# Patient Record
Sex: Male | Born: 1970
Health system: Southern US, Community
[De-identification: ages and names within clinical notes are randomized; demographics above are authoritative.]

## PROBLEM LIST (undated history)

## (undated) DIAGNOSIS — I639 Cerebral infarction, unspecified: Secondary | ICD-10-CM

## (undated) DIAGNOSIS — E119 Type 2 diabetes mellitus without complications: Secondary | ICD-10-CM

## (undated) DIAGNOSIS — I10 Essential (primary) hypertension: Secondary | ICD-10-CM

## (undated) DIAGNOSIS — E785 Hyperlipidemia, unspecified: Secondary | ICD-10-CM

## (undated) DIAGNOSIS — K219 Gastro-esophageal reflux disease without esophagitis: Secondary | ICD-10-CM

## (undated) HISTORY — DX: Essential (primary) hypertension: I10

## (undated) HISTORY — DX: Gastro-esophageal reflux disease without esophagitis: K21.9

## (undated) HISTORY — DX: Cerebral infarction, unspecified: I63.9

## (undated) HISTORY — PX: HERNIA REPAIR: SHX51

## (undated) HISTORY — DX: Hyperlipidemia, unspecified: E78.5

## (undated) HISTORY — PX: TONSILLECTOMY: SUR1361

## (undated) HISTORY — DX: Type 2 diabetes mellitus without complications: E11.9

---

## 2008-08-24 ENCOUNTER — Emergency Department (HOSPITAL_COMMUNITY): Admission: EM | Admit: 2008-08-24 | Discharge: 2008-08-24 | Payer: Self-pay | Admitting: Emergency Medicine

## 2011-09-11 ENCOUNTER — Emergency Department (HOSPITAL_COMMUNITY): Payer: Self-pay

## 2011-09-11 ENCOUNTER — Emergency Department (HOSPITAL_COMMUNITY)
Admission: EM | Admit: 2011-09-11 | Discharge: 2011-09-11 | Disposition: A | Discharge status: Home / Self Care | Payer: Self-pay | Attending: Emergency Medicine | Admitting: Emergency Medicine

## 2011-09-11 DIAGNOSIS — R110 Nausea: Secondary | ICD-10-CM | POA: Insufficient documentation

## 2011-09-11 DIAGNOSIS — R1033 Periumbilical pain: Secondary | ICD-10-CM | POA: Insufficient documentation

## 2011-09-11 DIAGNOSIS — F411 Generalized anxiety disorder: Secondary | ICD-10-CM | POA: Insufficient documentation

## 2011-09-11 DIAGNOSIS — K420 Umbilical hernia with obstruction, without gangrene: Secondary | ICD-10-CM | POA: Insufficient documentation

## 2011-09-11 LAB — DIFFERENTIAL
Eosinophils Absolute: 0.2 10*3/uL (ref 0.0–0.7)
Eosinophils Relative: 2 % (ref 0–5)
Lymphs Abs: 4.4 10*3/uL — ABNORMAL HIGH (ref 0.7–4.0)
Monocytes Relative: 10 % (ref 3–12)

## 2011-09-11 LAB — COMPREHENSIVE METABOLIC PANEL
AST: 22 U/L (ref 0–37)
CO2: 29 mEq/L (ref 19–32)
Chloride: 104 mEq/L (ref 96–112)
Creatinine, Ser: 0.81 mg/dL (ref 0.50–1.35)
GFR calc non Af Amer: >60 mL/min (ref >60)
Glucose, Bld: 96 mg/dL (ref 70–99)
Total Bilirubin: 0.4 mg/dL (ref 0.3–1.2)

## 2011-09-11 LAB — URINALYSIS, ROUTINE W REFLEX MICROSCOPIC
Glucose, UA: NEGATIVE mg/dL
Hgb urine dipstick: NEGATIVE
Ketones, ur: NEGATIVE mg/dL
Protein, ur: NEGATIVE mg/dL

## 2011-09-11 LAB — CBC
MCH: 31.6 pg (ref 26.0–34.0)
MCV: 84.3 fL (ref 78.0–100.0)
Platelets: 212 10*3/uL (ref 150–400)
RDW: 12.6 % (ref 11.5–15.5)

## 2011-09-11 MED ORDER — IOHEXOL 300 MG/ML  SOLN
100.0000 mL | Freq: Once | INTRAMUSCULAR | Status: AC | PRN
  Administered 2011-09-11: 100 mL via INTRAVENOUS

## 2016-03-29 ENCOUNTER — Ambulatory Visit (INDEPENDENT_AMBULATORY_CARE_PROVIDER_SITE_OTHER): Payer: Worker's Compensation | Admitting: Family Medicine

## 2016-03-29 ENCOUNTER — Encounter: Payer: Self-pay | Admitting: Family Medicine

## 2016-03-29 ENCOUNTER — Other Ambulatory Visit: Payer: Self-pay | Admitting: Family Medicine

## 2016-03-29 ENCOUNTER — Ambulatory Visit (HOSPITAL_BASED_OUTPATIENT_CLINIC_OR_DEPARTMENT_OTHER)
Admission: RE | Admit: 2016-03-29 | Discharge: 2016-03-29 | Disposition: A | Discharge status: Home / Self Care | Payer: Worker's Compensation | Source: Ambulatory Visit | Attending: Family Medicine | Admitting: Family Medicine

## 2016-03-29 VITALS — BP 141/109 | HR 69 | Ht 71.0 in | Wt 230.0 lb

## 2016-03-29 DIAGNOSIS — S8992XA Unspecified injury of left lower leg, initial encounter: Secondary | ICD-10-CM | POA: Diagnosis not present

## 2016-03-29 DIAGNOSIS — M25562 Pain in left knee: Secondary | ICD-10-CM | POA: Insufficient documentation

## 2016-03-29 MED ORDER — DICLOFENAC SODIUM 75 MG PO TBEC: 75.0000 mg | DELAYED_RELEASE_TABLET | Freq: Two times a day (BID) | ORAL | Status: DC

## 2016-03-29 MED ORDER — METHYLPREDNISOLONE ACETATE 40 MG/ML IJ SUSP
40.0000 mg | Freq: Once | INTRAMUSCULAR | Status: AC
  Administered 2016-03-29: 40 mg via INTRA_ARTICULAR

## 2016-03-29 NOTE — Patient Instructions (Signed)
This is most consistent with a severe knee contusion and severe synovitis. A meniscus tear is less likely. These are treated similarly. Voltaren 75mg  twice a day with food. Cortisone injections are an option - you were given this today. It's important that you continue to stay active. Straight leg raises, knee extensions 3 sets of 10 once a day (add ankle weight if these become too easy). Consider physical therapy to strengthen muscles around the joint that hurts to take pressure off of the joint itself. Shoe inserts with good arch support may be helpful. Ice 15 minutes at a time 3-4 times a day as needed to help with pain. Follow up with me in 4 weeks for reevaluation. Try to minimize squatting, lunging, kneeling as much as possible.

## 2016-03-30 DIAGNOSIS — S8992XA Unspecified injury of left lower leg, initial encounter: Secondary | ICD-10-CM | POA: Insufficient documentation

## 2016-03-30 LAB — SYNOVIAL CELL COUNT + DIFF, W/ CRYSTALS
BASOPHILS, %: 0 %
Eosinophils-Synovial: 0 % (ref 0–2)
LYMPHOCYTES-SYNOVIAL FLD: 45 % (ref 0–74)
Monocyte/Macrophage: 54 % (ref 0–69)
NEUTROPHIL, SYNOVIAL: 1 % (ref 0–24)
Synoviocytes, %: 0 % (ref 0–15)
WBC, SYNOVIAL: 665 {cells}/uL — AB (ref <150)

## 2016-03-30 NOTE — Progress Notes (Addendum)
PCP: No primary care provider on file.  Subjective:   HPI: Patient is a 45 y.o. male here for left knee injury.  Patient reports on 3/4 he was at work moving some shelves when one of them struck him medial aspect of left knee. Was able to walk after this and continued working. Pain and swelling progressed though, reached peak about 1 1/2 weeks ago with significant swelling. Reports this knee is giving out, popping a lot. Has tried ibuprofen, icing. Pain level is 2/10 but worse at night, does wake him up, sharp. No skin changes, fever, other complaints.  No past medical history on file.  No current outpatient prescriptions on file prior to visit.   No current facility-administered medications on file prior to visit.    No past surgical history on file.  Allergies  Allergen Reactions  . Sulfa Antibiotics     Social History   Social History  . Marital Status: Married    Spouse Name: N/A  . Number of Children: N/A  . Years of Education: N/A   Occupational History  . Not on file.   Social History Main Topics  . Smoking status: Former Games developermoker  . Smokeless tobacco: Not on file  . Alcohol Use: Not on file  . Drug Use: Not on file  . Sexual Activity: Not on file   Other Topics Concern  . Not on file   Social History Narrative  . No narrative on file    No family history on file.  BP 141/109 mmHg  Pulse 69  Ht 5\' 11"  (1.803 m)  Wt 230 lb (104.327 kg)  BMI 32.09 kg/m2  Review of Systems: See HPI above.    Objective:  Physical Exam:  Gen: NAD, comfortable in exam room  Left knee: Mod effusion.  No bruising, other deformity. TTP medial joint line only. FROM with pain on full flexion. Negative ant/post drawers. Negative valgus/varus testing. Negative lachmanns. Negative mcmurrays, apleys, patellar apprehension. NV intact distally.  Right knee: FROM without pain.    Assessment & Plan:  1. Left knee injury - Independently reviewed radiographs - no  evidence fracture.  Mechanism and exam consistent with knee contusion and severe synovitis.  Voltaren with icing, compression, elevation.  Aspiration and injection performed today.  F/u in 4 weeks.  After informed written consent patient was lying supine on exam table.  Left knee was prepped with alcohol swab.  Utilizing superolateral approach, 3 mL of marcaine was used for local anesthesia.  Then using an 18g needle on 60cc syringe, 46 mL of clear straw-colored fluid was aspirated from left knee.  Knee was then injected with 3:1 marcaine:depomedrol.  Patient tolerated procedure well without immediate complication.  Addendum:  Synovial fluid analysis without crystals, no growth.  Slightly elevated cell count at 665 but otherwise normal.  Patient notified.

## 2016-03-30 NOTE — Assessment & Plan Note (Signed)
Independently reviewed radiographs - no evidence fracture.  Mechanism and exam consistent with knee contusion and severe synovitis.  Voltaren with icing, compression, elevation.  Aspiration and injection performed today.  F/u in 4 weeks.  After informed written consent patient was lying supine on exam table.  Left knee was prepped with alcohol swab.  Utilizing superolateral approach, 3 mL of marcaine was used for local anesthesia.  Then using an 18g needle on 60cc syringe, 46 mL of clear straw-colored fluid was aspirated from left knee.  Knee was then injected with 3:1 marcaine:depomedrol.  Patient tolerated procedure well without immediate complication.

## 2016-04-02 LAB — BODY FLUID CULTURE
Gram Stain: NONE SEEN
ORGANISM ID, BACTERIA: NO GROWTH

## 2016-04-05 NOTE — Addendum Note (Signed)
Addended by: Lenda KelpHUDNALL, SHANE R on: 04/05/2016 03:04 PM   Modules accepted: Kipp BroodSmartSet

## 2016-04-16 ENCOUNTER — Encounter: Payer: Self-pay | Admitting: Family Medicine

## 2016-04-26 ENCOUNTER — Ambulatory Visit (INDEPENDENT_AMBULATORY_CARE_PROVIDER_SITE_OTHER): Payer: Worker's Compensation | Admitting: Family Medicine

## 2016-04-26 ENCOUNTER — Encounter: Payer: Self-pay | Admitting: Family Medicine

## 2016-04-26 VITALS — BP 125/89 | HR 73 | Ht 71.0 in | Wt 227.0 lb

## 2016-04-26 DIAGNOSIS — S8992XD Unspecified injury of left lower leg, subsequent encounter: Secondary | ICD-10-CM

## 2016-04-26 NOTE — Assessment & Plan Note (Signed)
Independently reviewed radiographs last visit - no evidence fracture.  Still suspect knee contusion and severe synovitis as his primary problems.  He will continue with voltaren, icing, compression, elevation.  S/p aspiration and cortisone injection.  If still not improving at f/u in 1 month to 6 weeks would consider MRI to assess for meniscus tear.

## 2016-04-26 NOTE — Progress Notes (Signed)
PCP: No primary care provider on file.  Subjective:   HPI: Patient is a 45 y.o. male here for left knee injury.  4/3: Patient reports on 3/4 he was at work moving some shelves when one of them struck him medial aspect of left knee. Was able to walk after this and continued working. Pain and swelling progressed though, reached peak about 1 1/2 weeks ago with significant swelling. Reports this knee is giving out, popping a lot. Has tried ibuprofen, icing. Pain level is 2/10 but worse at night, does wake him up, sharp. No skin changes, fever, other complaints.  5/1: Patient reports he's doing better. For 2 weeks following injection he was significantly improved then pain started to recur. Has been popping. Pain level is 2/10, dull and anteromedial. No skin changes, numbness. Taking voltaren which also helps.  No past medical history on file.  Current Outpatient Prescriptions on File Prior to Visit  Medication Sig Dispense Refill  . diclofenac (VOLTAREN) 75 MG EC tablet Take 1 tablet (75 mg total) by mouth 2 (two) times daily. 60 tablet 1  . lisinopril (PRINIVIL,ZESTRIL) 20 MG tablet Take 20 mg by mouth daily.    Marland Kitchen. lovastatin (MEVACOR) 20 MG tablet Take 20 mg by mouth at bedtime.     No current facility-administered medications on file prior to visit.    No past surgical history on file.  Allergies  Allergen Reactions  . Sulfa Antibiotics     Social History   Social History  . Marital Status: Married    Spouse Name: N/A  . Number of Children: N/A  . Years of Education: N/A   Occupational History  . Not on file.   Social History Main Topics  . Smoking status: Former Games developermoker  . Smokeless tobacco: Not on file  . Alcohol Use: Not on file  . Drug Use: Not on file  . Sexual Activity: Not on file   Other Topics Concern  . Not on file   Social History Narrative    No family history on file.  BP 125/89 mmHg  Pulse 73  Ht 5\' 11"  (1.803 m)  Wt 227 lb (102.967  kg)  BMI 31.67 kg/m2  Review of Systems: See HPI above.    Objective:  Physical Exam:  Gen: NAD, comfortable in exam room  Left knee: Very small effusion.  No bruising, other deformity. TTP medial joint line only. FROM with pain on full flexion. Negative ant/post drawers. Negative valgus/varus testing. Negative lachmanns. Mild pain mcmurrays, apleys.  Negative patellar apprehension. NV intact distally.  Right knee: FROM without pain.    Assessment & Plan:  1. Left knee injury - Independently reviewed radiographs last visit - no evidence fracture.  Still suspect knee contusion and severe synovitis as his primary problems.  He will continue with voltaren, icing, compression, elevation.  S/p aspiration and cortisone injection.  If still not improving at f/u in 1 month to 6 weeks would consider MRI to assess for meniscus tear.

## 2016-04-26 NOTE — Patient Instructions (Signed)
This is most consistent with a severe knee contusion and severe synovitis. A meniscus tear is less likely though you still have pain in this area - we will monitor closely and if still not improving at next follow-up I would recommend an MRI. Voltaren 75mg  twice a day with food. It's important that you continue to stay active. Straight leg raises, knee extensions 3 sets of 10 once a day (add ankle weight if these become too easy). Consider physical therapy to strengthen muscles around the joint that hurts to take pressure off of the joint itself. Shoe inserts with good arch support may be helpful. Ice 15 minutes at a time 3-4 times a day as needed to help with pain. Follow up with me in 1 month to 6 weeks. Try to minimize squatting, lunging, kneeling as much as possible.

## 2016-05-31 ENCOUNTER — Encounter: Payer: Self-pay | Admitting: Family Medicine

## 2016-05-31 ENCOUNTER — Ambulatory Visit (INDEPENDENT_AMBULATORY_CARE_PROVIDER_SITE_OTHER): Payer: Worker's Compensation | Admitting: Family Medicine

## 2016-05-31 VITALS — BP 122/79 | HR 76 | Ht 71.0 in | Wt 227.0 lb

## 2016-05-31 DIAGNOSIS — S8992XD Unspecified injury of left lower leg, subsequent encounter: Secondary | ICD-10-CM

## 2016-05-31 NOTE — Patient Instructions (Signed)
We will go ahead with an MRI of this knee given you're not improving as expected to assess for a meniscus tear.This is most consistent with a severe knee contusion and severe synovitis. Continue voltaren 75mg  twice a day with food as needed. It's important that you continue to stay active. Straight leg raises, knee extensions 3 sets of 10 once a day (add ankle weight if these become too easy). Ice 15 minutes at a time 3-4 times a day as needed to help with pain. Try to minimize squatting, lunging, kneeling as much as possible.

## 2016-06-01 NOTE — Assessment & Plan Note (Signed)
Radiographs negative.  Continues to have pain medial joint line following a fall.  Not improving as I would expect at this point 2 months out.  Advised we go ahead with MRI to assess for meniscus tear.  Continue voltaren, home exercises, icing in meantime.

## 2016-06-01 NOTE — Progress Notes (Signed)
PCP: No primary care provider on file.  Subjective:   HPI: Patient is a 45 y.o. male here for left knee injury.  4/3: Patient reports on 3/4 he was at work moving some shelves when one of them struck him medial aspect of left knee. Was able to walk after this and continued working. Pain and swelling progressed though, reached peak about 1 1/2 weeks ago with significant swelling. Reports this knee is giving out, popping a lot. Has tried ibuprofen, icing. Pain level is 2/10 but worse at night, does wake him up, sharp. No skin changes, fever, other complaints.  5/1: Patient reports he's doing better. For 2 weeks following injection he was significantly improved then pain started to recur. Has been popping. Pain level is 2/10, dull and anteromedial. No skin changes, numbness. Taking voltaren which also helps.  6/5: Patient reports he continues to have 4/10 level knee pain. Worse getting up and down from sitting. Pain is anterior, medial, dull. Some swelling. Better with rest, worse by end of day. No skin changes, numbness.  No past medical history on file.  Current Outpatient Prescriptions on File Prior to Visit  Medication Sig Dispense Refill  . diclofenac (VOLTAREN) 75 MG EC tablet Take 1 tablet (75 mg total) by mouth 2 (two) times daily. 60 tablet 1  . lisinopril (PRINIVIL,ZESTRIL) 20 MG tablet Take 20 mg by mouth daily.    Marland Kitchen. lovastatin (MEVACOR) 20 MG tablet Take 20 mg by mouth at bedtime.     No current facility-administered medications on file prior to visit.    No past surgical history on file.  Allergies  Allergen Reactions  . Sulfa Antibiotics     Social History   Social History  . Marital Status: Married    Spouse Name: N/A  . Number of Children: N/A  . Years of Education: N/A   Occupational History  . Not on file.   Social History Main Topics  . Smoking status: Former Games developermoker  . Smokeless tobacco: Not on file  . Alcohol Use: Not on file  . Drug  Use: Not on file  . Sexual Activity: Not on file   Other Topics Concern  . Not on file   Social History Narrative    No family history on file.  BP 122/79 mmHg  Pulse 76  Ht 5\' 11"  (1.803 m)  Wt 227 lb (102.967 kg)  BMI 31.67 kg/m2  Review of Systems: See HPI above.    Objective:  Physical Exam:  Gen: NAD, comfortable in exam room  Left knee: Minimal effusion.  No bruising, other deformity. TTP medial joint line only. FROM. Negative ant/post drawers. Negative valgus/varus testing. Negative lachmanns. Mild pain mcmurrays, apleys.  Negative patellar apprehension. NV intact distally.  Right knee: FROM without pain.    Assessment & Plan:  1. Left knee injury - Radiographs negative.  Continues to have pain medial joint line following a fall.  Not improving as I would expect at this point 2 months out.  Advised we go ahead with MRI to assess for meniscus tear.  Continue voltaren, home exercises, icing in meantime.

## 2017-12-23 ENCOUNTER — Other Ambulatory Visit (HOSPITAL_COMMUNITY): Payer: Self-pay | Admitting: Pulmonary Disease

## 2017-12-23 DIAGNOSIS — R10811 Right upper quadrant abdominal tenderness: Secondary | ICD-10-CM

## 2017-12-28 MED FILL — metFORMIN HCL 500 MG TABS: 500 | 30 days supply | Qty: 60 | Fill #0

## 2018-01-02 ENCOUNTER — Encounter (HOSPITAL_COMMUNITY): Payer: Self-pay

## 2018-01-02 ENCOUNTER — Ambulatory Visit (HOSPITAL_COMMUNITY)
Admission: RE | Admit: 2018-01-02 | Discharge: 2018-01-02 | Disposition: A | Discharge status: Home / Self Care | Payer: No Typology Code available for payment source | Source: Ambulatory Visit | Attending: Pulmonary Disease | Admitting: Pulmonary Disease

## 2018-01-02 DIAGNOSIS — R10811 Right upper quadrant abdominal tenderness: Secondary | ICD-10-CM | POA: Diagnosis present

## 2018-01-02 DIAGNOSIS — K760 Fatty (change of) liver, not elsewhere classified: Secondary | ICD-10-CM | POA: Insufficient documentation

## 2018-01-02 LAB — POCT I-STAT CREATININE: Creatinine, Ser: 1 mg/dL (ref 0.61–1.24)

## 2018-01-02 MED ORDER — IOPAMIDOL (ISOVUE-300) INJECTION 61%
100.0000 mL | Freq: Once | INTRAVENOUS | Status: AC | PRN
  Administered 2018-01-02: 100 mL via INTRAVENOUS

## 2018-01-02 MED ORDER — IOPAMIDOL (ISOVUE-300) INJECTION 61%
INTRAVENOUS | Status: AC
  Filled 2018-01-02: qty 100

## 2018-01-27 MED FILL — metFORMIN HCL 500 MG TABS: 500 | 30 days supply | Qty: 60 | Fill #0

## 2018-02-07 ENCOUNTER — Other Ambulatory Visit (HOSPITAL_COMMUNITY)
Admission: RE | Admit: 2018-02-07 | Discharge: 2018-02-07 | Disposition: A | Discharge status: Home / Self Care | Payer: No Typology Code available for payment source | Source: Ambulatory Visit | Attending: Pulmonary Disease | Admitting: Pulmonary Disease

## 2018-02-07 DIAGNOSIS — M545 Low back pain: Secondary | ICD-10-CM | POA: Insufficient documentation

## 2018-02-07 DIAGNOSIS — I10 Essential (primary) hypertension: Secondary | ICD-10-CM | POA: Diagnosis present

## 2018-02-07 DIAGNOSIS — E119 Type 2 diabetes mellitus without complications: Secondary | ICD-10-CM | POA: Insufficient documentation

## 2018-02-07 LAB — COMPREHENSIVE METABOLIC PANEL
ALBUMIN: 4.3 g/dL (ref 3.5–5.0)
ALT: 29 U/L (ref 17–63)
ANION GAP: 11 (ref 5–15)
AST: 30 U/L (ref 15–41)
Alkaline Phosphatase: 68 U/L (ref 38–126)
BILIRUBIN TOTAL: 0.6 mg/dL (ref 0.3–1.2)
BUN: 14 mg/dL (ref 6–20)
CALCIUM: 9.4 mg/dL (ref 8.9–10.3)
CO2: 26 mmol/L (ref 22–32)
Chloride: 100 mmol/L — ABNORMAL LOW (ref 101–111)
Creatinine, Ser: 0.85 mg/dL (ref 0.61–1.24)
GFR calc non Af Amer: >60 mL/min (ref >60)
GLUCOSE: 118 mg/dL — AB (ref 65–99)
POTASSIUM: 3.9 mmol/L (ref 3.5–5.1)
SODIUM: 137 mmol/L (ref 135–145)
TOTAL PROTEIN: 7.6 g/dL (ref 6.5–8.1)

## 2018-02-07 LAB — LIPID PANEL
CHOL/HDL RATIO: 5.2 ratio
Cholesterol: 194 mg/dL (ref 0–200)
HDL: 37 mg/dL — AB (ref >40)
LDL Cholesterol: 102 mg/dL — ABNORMAL HIGH (ref 0–99)
TRIGLYCERIDES: 274 mg/dL — AB (ref <150)
VLDL: 55 mg/dL — ABNORMAL HIGH (ref 0–40)

## 2018-02-07 LAB — HEMOGLOBIN A1C
HEMOGLOBIN A1C: 8.1 % — AB (ref 4.8–5.6)
Mean Plasma Glucose: 185.77 mg/dL

## 2018-02-17 MED FILL — ACCU-CHEK GUIDE TEST STRIP: 50 days supply | Qty: 100 | Fill #0

## 2018-02-27 MED FILL — LOVASTATIN 20 MG TABLET: 20 | 90 days supply | Qty: 90 | Fill #0

## 2018-02-27 MED FILL — LISINOPRIL 20 MG TABLET: 20 | 90 days supply | Qty: 90 | Fill #0

## 2018-02-27 MED FILL — metFORMIN HCL 500 MG TABS: 500 | 30 days supply | Qty: 60 | Fill #1

## 2018-03-13 MED FILL — metFORMIN HCL 1000 MG TABS: 1000 | 90 days supply | Qty: 180 | Fill #0

## 2018-05-08 ENCOUNTER — Other Ambulatory Visit (HOSPITAL_COMMUNITY)
Admission: RE | Admit: 2018-05-08 | Discharge: 2018-05-08 | Disposition: A | Discharge status: Home / Self Care | Payer: No Typology Code available for payment source | Source: Ambulatory Visit | Attending: Pulmonary Disease | Admitting: Pulmonary Disease

## 2018-05-08 DIAGNOSIS — M545 Low back pain: Secondary | ICD-10-CM | POA: Insufficient documentation

## 2018-05-08 DIAGNOSIS — E669 Obesity, unspecified: Secondary | ICD-10-CM | POA: Insufficient documentation

## 2018-05-08 DIAGNOSIS — I10 Essential (primary) hypertension: Secondary | ICD-10-CM | POA: Insufficient documentation

## 2018-05-08 DIAGNOSIS — E1165 Type 2 diabetes mellitus with hyperglycemia: Secondary | ICD-10-CM | POA: Insufficient documentation

## 2018-06-05 MED FILL — LISINOPRIL 20 MG TABLET: 20 | 90 days supply | Qty: 90 | Fill #1

## 2018-06-05 MED FILL — LOVASTATIN 20 MG TABS: 20 | 90 days supply | Qty: 90 | Fill #1

## 2018-06-05 MED FILL — metFORMIN HCL 1000 MG TABS: 1000 | 90 days supply | Qty: 180 | Fill #1

## 2018-07-31 MED FILL — ACCU-CHEK GUIDE TEST STRIP: 50 days supply | Qty: 100 | Fill #0

## 2018-09-08 MED FILL — LOVASTATIN 20 MG TABS: 20 | 90 days supply | Qty: 90 | Fill #2

## 2018-09-08 MED FILL — LISINOPRIL 20 MG TABLET: 20 | 90 days supply | Qty: 90 | Fill #2

## 2018-09-08 MED FILL — metFORMIN HCL 1000 MG TABS: 1000 | 90 days supply | Qty: 180 | Fill #2

## 2018-12-04 MED FILL — LOVASTATIN 20 MG TABS: 20 | 90 days supply | Qty: 90 | Fill #0

## 2018-12-04 MED FILL — LISINOPRIL 20 MG TABLET: 20 | 30 days supply | Qty: 30 | Fill #0

## 2018-12-26 MED FILL — LISINOPRIL 20 MG TABLET: 20 | 30 days supply | Qty: 30 | Fill #1

## 2018-12-26 MED FILL — metFORMIN HCL 1000 MG TABS: 1000 | 90 days supply | Qty: 180 | Fill #3

## 2019-01-08 ENCOUNTER — Other Ambulatory Visit (HOSPITAL_COMMUNITY)
Admission: RE | Admit: 2019-01-08 | Discharge: 2019-01-08 | Disposition: A | Discharge status: Home / Self Care | Payer: No Typology Code available for payment source | Source: Ambulatory Visit | Attending: Pulmonary Disease | Admitting: Pulmonary Disease

## 2019-01-08 DIAGNOSIS — E669 Obesity, unspecified: Secondary | ICD-10-CM | POA: Insufficient documentation

## 2019-01-08 DIAGNOSIS — E785 Hyperlipidemia, unspecified: Secondary | ICD-10-CM | POA: Insufficient documentation

## 2019-01-08 DIAGNOSIS — I10 Essential (primary) hypertension: Secondary | ICD-10-CM | POA: Diagnosis present

## 2019-01-08 DIAGNOSIS — E1165 Type 2 diabetes mellitus with hyperglycemia: Secondary | ICD-10-CM | POA: Insufficient documentation

## 2019-01-08 LAB — BASIC METABOLIC PANEL
Anion gap: 8 (ref 5–15)
BUN: 15 mg/dL (ref 6–20)
CO2: 24 mmol/L (ref 22–32)
Calcium: 9.3 mg/dL (ref 8.9–10.3)
Chloride: 107 mmol/L (ref 98–111)
Creatinine, Ser: 0.77 mg/dL (ref 0.61–1.24)
GFR calc Af Amer: >60 mL/min (ref >60)
GFR calc non Af Amer: >60 mL/min (ref >60)
Glucose, Bld: 125 mg/dL — ABNORMAL HIGH (ref 70–99)
Potassium: 3.9 mmol/L (ref 3.5–5.1)
Sodium: 139 mmol/L (ref 135–145)

## 2019-01-08 LAB — LIPID PANEL
Cholesterol: 189 mg/dL (ref 0–200)
HDL: 40 mg/dL — ABNORMAL LOW (ref >40)
LDL Cholesterol: 105 mg/dL — ABNORMAL HIGH (ref 0–99)
Total CHOL/HDL Ratio: 4.7 RATIO
Triglycerides: 220 mg/dL — ABNORMAL HIGH (ref <150)
VLDL: 44 mg/dL — ABNORMAL HIGH (ref 0–40)

## 2019-01-08 LAB — HEMOGLOBIN A1C
Hgb A1c MFr Bld: 6.6 % — ABNORMAL HIGH (ref 4.8–5.6)
MEAN PLASMA GLUCOSE: 142.72 mg/dL

## 2019-01-10 MED FILL — ACCU-CHEK GUIDE W/DEVICE KI: W/DEVICE | 30 days supply | Qty: 1 | Fill #0

## 2019-01-10 MED FILL — ACCU-CHEK GUIDE TEST STRIP: 50 days supply | Qty: 100 | Fill #1

## 2019-01-22 MED FILL — HYDROCODON-APAP 7.5-325: 7.5-325 | 1 days supply | Qty: 6 | Fill #0

## 2019-01-22 MED FILL — AMOXICILLIN 875 MG TABS: 875 | 5 days supply | Qty: 10 | Fill #0

## 2019-02-08 MED FILL — LISINOPRIL 20 MG TABLET: 20 | 30 days supply | Qty: 30 | Fill #2

## 2019-03-08 MED FILL — LISINOPRIL 20 MG TABLET: 20 | 30 days supply | Qty: 30 | Fill #3

## 2019-03-15 MED FILL — LOVASTATIN 20 MG TABS: 20 | 90 days supply | Qty: 90 | Fill #1

## 2019-03-30 MED FILL — metFORMIN HCL 1000 MG TABS: 1000 | 90 days supply | Qty: 180 | Fill #0

## 2019-04-11 MED FILL — LISINOPRIL 20 MG TABLET: 20 | 30 days supply | Qty: 30 | Fill #4

## 2019-05-14 MED FILL — LISINOPRIL 20 MG TABLET: 20 | 30 days supply | Qty: 30 | Fill #5

## 2019-06-11 MED FILL — LOVASTATIN 20 MG TABS: 20 | 90 days supply | Qty: 90 | Fill #2

## 2019-06-11 MED FILL — LISINOPRIL 20 MG TABLET: 20 | 30 days supply | Qty: 30 | Fill #6

## 2019-07-09 ENCOUNTER — Other Ambulatory Visit (HOSPITAL_COMMUNITY)
Admission: RE | Admit: 2019-07-09 | Discharge: 2019-07-09 | Disposition: A | Discharge status: Home / Self Care | Payer: No Typology Code available for payment source | Source: Ambulatory Visit | Attending: Pulmonary Disease | Admitting: Pulmonary Disease

## 2019-07-09 ENCOUNTER — Other Ambulatory Visit: Payer: Self-pay

## 2019-07-09 DIAGNOSIS — E119 Type 2 diabetes mellitus without complications: Secondary | ICD-10-CM | POA: Diagnosis not present

## 2019-07-09 DIAGNOSIS — I10 Essential (primary) hypertension: Secondary | ICD-10-CM | POA: Diagnosis present

## 2019-07-09 LAB — COMPREHENSIVE METABOLIC PANEL
ALT: 20 U/L (ref 0–44)
AST: 22 U/L (ref 15–41)
Albumin: 3.9 g/dL (ref 3.5–5.0)
Alkaline Phosphatase: 62 U/L (ref 38–126)
Anion gap: 10 (ref 5–15)
BUN: 19 mg/dL (ref 6–20)
CO2: 26 mmol/L (ref 22–32)
Calcium: 9.4 mg/dL (ref 8.9–10.3)
Chloride: 103 mmol/L (ref 98–111)
Creatinine, Ser: 0.77 mg/dL (ref 0.61–1.24)
GFR calc Af Amer: >60 mL/min (ref >60)
GFR calc non Af Amer: >60 mL/min (ref >60)
Glucose, Bld: 103 mg/dL — ABNORMAL HIGH (ref 70–99)
Potassium: 4.2 mmol/L (ref 3.5–5.1)
Sodium: 139 mmol/L (ref 135–145)
Total Bilirubin: 0.6 mg/dL (ref 0.3–1.2)
Total Protein: 6.7 g/dL (ref 6.5–8.1)

## 2019-07-09 LAB — LIPID PANEL
Cholesterol: 186 mg/dL (ref 0–200)
HDL: 35 mg/dL — ABNORMAL LOW
LDL Cholesterol: 110 mg/dL — ABNORMAL HIGH (ref 0–99)
Total CHOL/HDL Ratio: 5.3 ratio
Triglycerides: 207 mg/dL — ABNORMAL HIGH
VLDL: 41 mg/dL — ABNORMAL HIGH (ref 0–40)

## 2019-07-09 LAB — HEMOGLOBIN A1C
Hgb A1c MFr Bld: 6.1 % — ABNORMAL HIGH (ref 4.8–5.6)
Mean Plasma Glucose: 128.37 mg/dL

## 2019-07-09 MED FILL — ACCU-CHEK GUIDE TEST STRIP: 50 days supply | Qty: 100 | Fill #2

## 2019-07-09 MED FILL — LISINOPRIL 20 MG TABLET: 20 | 30 days supply | Qty: 30 | Fill #7

## 2019-07-09 MED FILL — metFORMIN HCL 1000 MG TABS: 1000 | 90 days supply | Qty: 180 | Fill #1

## 2019-07-17 ENCOUNTER — Other Ambulatory Visit: Payer: Self-pay | Admitting: Pulmonary Disease

## 2019-07-17 ENCOUNTER — Other Ambulatory Visit (HOSPITAL_COMMUNITY): Payer: Self-pay | Admitting: Pulmonary Disease

## 2019-07-17 DIAGNOSIS — R109 Unspecified abdominal pain: Secondary | ICD-10-CM

## 2019-07-17 MED FILL — AMOX-CLAV 875-125 MG TABLET: 875-125 | 10 days supply | Qty: 20 | Fill #0

## 2019-07-18 ENCOUNTER — Ambulatory Visit (HOSPITAL_COMMUNITY)
Admission: RE | Admit: 2019-07-18 | Discharge: 2019-07-18 | Disposition: A | Discharge status: Home / Self Care | Payer: No Typology Code available for payment source | Source: Ambulatory Visit | Attending: Pulmonary Disease | Admitting: Pulmonary Disease

## 2019-07-18 ENCOUNTER — Other Ambulatory Visit: Payer: Self-pay

## 2019-07-18 DIAGNOSIS — R109 Unspecified abdominal pain: Secondary | ICD-10-CM | POA: Diagnosis not present

## 2019-07-18 MED ORDER — IOHEXOL 300 MG/ML  SOLN
100.0000 mL | Freq: Once | INTRAMUSCULAR | Status: AC | PRN
  Administered 2019-07-18: 100 mL via INTRAVENOUS

## 2019-07-26 ENCOUNTER — Ambulatory Visit: Payer: Self-pay | Admitting: General Surgery

## 2019-08-09 ENCOUNTER — Other Ambulatory Visit: Payer: Self-pay

## 2019-08-09 ENCOUNTER — Ambulatory Visit (INDEPENDENT_AMBULATORY_CARE_PROVIDER_SITE_OTHER): Payer: No Typology Code available for payment source | Admitting: General Surgery

## 2019-08-09 ENCOUNTER — Encounter: Payer: Self-pay | Admitting: General Surgery

## 2019-08-09 VITALS — BP 134/90 | HR 64 | Temp 97.7°F | Resp 18 | Ht 71.0 in | Wt 217.0 lb

## 2019-08-09 DIAGNOSIS — L03311 Cellulitis of abdominal wall: Secondary | ICD-10-CM | POA: Diagnosis not present

## 2019-08-09 NOTE — Progress Notes (Signed)
Marvin Tapia; [id number]; [birth year]   HPI Patient is a 47 year old white male who was referred to my care by Dr. Velvet Bathe for evaluation treatment of cellulitis of the abdominal wall.  He developed redness in an oval fashion along the anterior mid abdominal wall.  He did undergo a CT scan of the abdomen which revealed no subcutaneous abscess.  No recurrent hernia was present.  He is status post an umbilical herniorrhaphy with mesh 7 years ago in [facility].  He has had a small pustule superior and to the left of the umbilicus which has been stable for many years.  He thought he saw some plastic material emanating from it in the past, but has since closed over.  He was placed on antibiotics and the cellulitis has resolved.  He currently has 0 out of 10 abdominal pain.  No drainage has been noted from the wound. History reviewed. No pertinent past medical history.  History reviewed. No pertinent surgical history.  History reviewed. No pertinent family history.  Current Outpatient Medications on File Prior to Visit  Medication Sig Dispense Refill  . ACCU-CHEK GUIDE test strip     . lisinopril (PRINIVIL,ZESTRIL) 20 MG tablet Take 20 mg by mouth daily.    Marland Kitchen lovastatin (MEVACOR) 20 MG tablet Take 20 mg by mouth at bedtime.    . metFORMIN (GLUCOPHAGE) 1000 MG tablet     . diclofenac (VOLTAREN) 75 MG EC tablet Take 1 tablet (75 mg total) by mouth 2 (two) times daily. (Patient not taking: Reported on 08/09/2019) 60 tablet 1   No current facility-administered medications on file prior to visit.     Allergies  Allergen Reactions  . Sulfa Antibiotics     Social History   Substance and Sexual Activity  Alcohol Use Not Currently  . Alcohol/week: 0.0 standard drinks    Social History   Tobacco Use  Smoking Status Current Some Day Smoker  . Packs/day: 0.50  . Types: Cigarettes  Smokeless Tobacco Never Used    Review of Systems  Constitutional: Negative.   HENT: Negative.    Eyes: Negative.   Respiratory: Negative.   Cardiovascular: Negative.   Gastrointestinal: Positive for abdominal pain.  Genitourinary: Negative.   Musculoskeletal: Negative.   Skin: Negative.   Neurological: Negative.   Endo/Heme/Allergies: Negative.   Psychiatric/Behavioral: Negative.     Objective   Vitals:   08/09/19 0857  BP: 134/90  Pulse: 64  Resp: 18  Temp: 97.7 F (36.5 C)  SpO2: 96%    Physical Exam Vitals signs reviewed.  Constitutional:      Appearance: Normal appearance. He is not ill-appearing.  HENT:     Head: Normocephalic and atraumatic.  Cardiovascular:     Rate and Rhythm: Normal rate and regular rhythm.     Heart sounds: Normal heart sounds. No murmur. No friction rub. No gallop.   Pulmonary:     Effort: Pulmonary effort is normal. No respiratory distress.     Breath sounds: Normal breath sounds. No stridor. No wheezing, rhonchi or rales.  Abdominal:     General: There is no distension.     Palpations: Abdomen is soft. There is no mass.     Tenderness: There is no abdominal tenderness. There is no guarding or rebound.     Hernia: No hernia is present.     Comments: A transverse surgical incision is noted above the umbilicus.  A small closed papule is noted with underlying increased density superior and to  the left of the umbilicus.  No drainage is noted.  There is no erythema present.  He did take a picture of the redness and this has resolved.  No drainage is present.  There is no evidence of recurrence of the hernia.  Skin:    General: Skin is warm and dry.  Neurological:     Mental Status: He is alert and oriented to person, place, and time.    CT scan images personally reviewed.  Primary care notes reviewed. 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  I did tell the patient that should the  granuloma continue to get irritated and possibly drained, I could excise the area to remove any possible foreign body present.  He and his wife were fine with that.  He will follow-up with me should the cellulitis recur.

## 2019-08-27 MED FILL — LISINOPRIL 20 MG TABLET: 20 | 30 days supply | Qty: 30 | Fill #8

## 2019-09-19 MED FILL — LOVASTATIN 20 MG TABLET: 20 | 90 days supply | Qty: 90 | Fill #3

## 2019-09-20 ENCOUNTER — Other Ambulatory Visit: Payer: Self-pay

## 2019-09-20 ENCOUNTER — Ambulatory Visit (INDEPENDENT_AMBULATORY_CARE_PROVIDER_SITE_OTHER): Payer: No Typology Code available for payment source | Admitting: General Surgery

## 2019-09-20 ENCOUNTER — Encounter: Payer: Self-pay | Admitting: General Surgery

## 2019-09-20 VITALS — BP 135/83 | HR 83 | Temp 97.8°F | Resp 16 | Ht 71.0 in | Wt 218.0 lb

## 2019-09-20 DIAGNOSIS — L03311 Cellulitis of abdominal wall: Secondary | ICD-10-CM | POA: Diagnosis not present

## 2019-09-20 MED ORDER — DOXYCYCLINE HYCLATE 50 MG PO CAPS: 50.0000 mg | ORAL_CAPSULE | Freq: Two times a day (BID) | ORAL | 0 refills | Status: DC

## 2019-09-20 MED FILL — LISINOPRIL 20 MG TABLET: 20 | 30 days supply | Qty: 30 | Fill #9

## 2019-09-20 MED FILL — DOXYCYCLINE HYC 50 MG CAP: 50 | 10 days supply | Qty: 20 | Fill #0

## 2019-09-20 NOTE — Progress Notes (Signed)
Subjective:     Marvin Tapia  Patient presents with increasing cellulitis and fluctuance along an area in the mid abdomen, just superior to his previously identified small granuloma.  No drainage has been noted.  He denies any fever or chills.  It is sometimes tender to touch. Objective:    BP 135/83 (BP Location: Left Arm, Patient Position: Sitting, Cuff Size: Normal)   Pulse 83   Temp 97.8 F (36.6 C) (Tympanic)   Resp 16   Ht 5\' 11"  (1.803 m)   Wt 218 lb (98.9 kg)   SpO2 95%   BMI 30.40 kg/m   General:  alert, cooperative and no distress  Abdomen is soft.  Just superior to the previously identified granuloma and to the left of the midline, a 3 cm area of fluctuance is noted.  Needle aspiration was performed which revealed 1 cc of purulent material.     Assessment:    History of abdominal wall cellulitis, newly formed small abscess.  Status post ventral herniorrhaphy with mesh in the remote past.    Plan:   We will start doxycycline 100 mg p.o. twice daily x10 days.  I told the patient to keep the area clean and dry with soap and water.  No need for formal I&D at this time.  Should this not resolve, exploration of the wound may be indicated as this may be a suture granuloma or area of an infected mesh that was previously placed.  He understands this and agrees.  We will see the patient back again in 2 weeks.

## 2019-10-04 ENCOUNTER — Telehealth: Payer: No Typology Code available for payment source | Admitting: General Surgery

## 2019-10-04 ENCOUNTER — Telehealth (INDEPENDENT_AMBULATORY_CARE_PROVIDER_SITE_OTHER): Payer: Self-pay | Admitting: General Surgery

## 2019-10-04 ENCOUNTER — Ambulatory Visit: Payer: No Typology Code available for payment source | Admitting: General Surgery

## 2019-10-04 ENCOUNTER — Other Ambulatory Visit: Payer: Self-pay

## 2019-10-04 DIAGNOSIS — L03311 Cellulitis of abdominal wall: Secondary | ICD-10-CM

## 2019-10-04 NOTE — Telephone Encounter (Signed)
Patient called to let me know that his abdominal wall abscess had resolved.  He feels there is still a knot there, but I told him that was normal and it may be a retained suture.  He is going to watch it and call me should it flareup again.  Virtual visit was performed as his son was diagnosed with COVID-19.

## 2019-10-19 MED FILL — metFORMIN HCL 1000 MG TABS: 1000 | 90 days supply | Qty: 180 | Fill #2

## 2019-10-30 MED FILL — LISINOPRIL 20 MG TABLET: 20 | 30 days supply | Qty: 30 | Fill #10

## 2019-11-01 ENCOUNTER — Ambulatory Visit (INDEPENDENT_AMBULATORY_CARE_PROVIDER_SITE_OTHER): Payer: No Typology Code available for payment source | Admitting: General Surgery

## 2019-11-01 ENCOUNTER — Other Ambulatory Visit: Payer: Self-pay

## 2019-11-01 ENCOUNTER — Encounter: Payer: Self-pay | Admitting: General Surgery

## 2019-11-01 VITALS — BP 124/85 | HR 86 | Temp 98.0°F | Resp 16 | Ht 71.0 in | Wt 216.0 lb

## 2019-11-01 DIAGNOSIS — L03311 Cellulitis of abdominal wall: Secondary | ICD-10-CM | POA: Diagnosis not present

## 2019-11-01 MED ORDER — DOXYCYCLINE HYCLATE 50 MG PO CAPS: 100.0000 mg | ORAL_CAPSULE | Freq: Two times a day (BID) | ORAL | 1 refills | Status: DC

## 2019-11-01 MED FILL — DOXYCYCLINE HYC 100 MG CAPS: 100 | 7 days supply | Qty: 14 | Fill #0

## 2019-11-01 NOTE — Progress Notes (Signed)
Subjective:     Marvin Tapia  Patient presents with recurrent swelling and tenderness at the same site of previous abdominal wall cellulitis.  No drainage has been noted.  He states the tenderness and swelling have decreased over the past few days.  He was last treated with doxycycline with resolution of the cellulitis. Objective:    BP 124/85 (BP Location: Left Arm, Patient Position: Sitting, Cuff Size: Normal)   Pulse 86   Temp 98 F (36.7 C) (Oral)   Resp 16   Ht 5\' 11"  (1.803 m)   Wt 216 lb (98 kg)   SpO2 95%   BMI 30.13 kg/m   General:  alert, cooperative and no distress  Abdomen is soft.  An ovoid 3 cm area with minimal fluctuance and induration is noted in the same area of his previous episode.     Assessment:    Abdominal wall cellulitis most likely secondary to suture granuloma.  Mesh has been placed in the past for his hernia repair and this could be a portion of that.    Plan:   Vibramycin 100 mg p.o. twice daily x10 days.  I did tell him that eventually we may need to explore this due to the recurrence of the cellulitis.  He understands that he agrees.  We will follow up expectantly.

## 2019-11-26 MED FILL — DOXYCYCLINE HYC 100 MG CAPS: 100 | 7 days supply | Qty: 14 | Fill #1

## 2019-11-27 MED FILL — LISINOPRIL 20 MG TABLET: 20 | 30 days supply | Qty: 30 | Fill #11

## 2019-12-11 MED FILL — LISINOPRIL 20 MG TABLET: 20 | 90 days supply | Qty: 90 | Fill #0

## 2019-12-18 ENCOUNTER — Encounter: Payer: Self-pay | Admitting: General Surgery

## 2019-12-18 ENCOUNTER — Other Ambulatory Visit: Payer: Self-pay

## 2019-12-18 ENCOUNTER — Ambulatory Visit (INDEPENDENT_AMBULATORY_CARE_PROVIDER_SITE_OTHER): Payer: No Typology Code available for payment source | Admitting: General Surgery

## 2019-12-18 VITALS — BP 111/74 | HR 87 | Temp 98.2°F | Resp 16 | Ht 71.0 in | Wt 214.0 lb

## 2019-12-18 DIAGNOSIS — T8189XD Other complications of procedures, not elsewhere classified, subsequent encounter: Secondary | ICD-10-CM | POA: Diagnosis not present

## 2019-12-18 NOTE — Progress Notes (Signed)
Subjective:     Marvin Tapia  Here for follow-up of abdominal wall cellulitis.  Patient has had multiple episodes of bloody drainage from the wound on the abdominal wall.  He now notices a blue suture emanating from the wound. Objective:    BP 111/74 (BP Location: Left Arm, Patient Position: Sitting, Cuff Size: Normal)   Pulse 87   Temp 98.2 F (36.8 C) (Oral)   Resp 16   Ht 5\' 11"  (1.803 m)   Wt 214 lb (97.1 kg)   SpO2 95%   BMI 29.85 kg/m   General:  alert, cooperative and no distress  Abdomen: Small blue suture emanating from an area just medial to and induration site.  I was able to pull and remove 4 to 5 mm of a nonabsorbable blue suture.  No purulent drainage present.     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  Should this continue to be an issue, I do recommend exploration to remove any other sutures that may be in place.  He understands this and agrees.  We will follow-up with me expectantly.

## 2019-12-25 IMAGING — CT CT ABDOMEN AND PELVIS WITH CONTRAST
2 of 5 series · 16 of 46 positions shown, 18 images · IV contrast (omnipaque)
Comparison: January 02, 2018

CLINICAL DATA: Patient status post umbilical hernia repair 7 years
ago with abdominal pain and palpable mass in the area of prior
hernia repair.

EXAM:
CT ABDOMEN AND PELVIS WITH CONTRAST
TECHNIQUE: Multidetector CT imaging of the abdomen and pelvis was performed
using the standard protocol following bolus administration of
intravenous contrast.
CONTRAST:  100mL OMNIPAQUE IOHEXOL 300 MG/ML  SOLN

[Series 2: axial st · axial · 0.83mm/px · z∈[-458,-33]mm · 13 of 99 slices shown, 15 images]
[im 7/99  soft-tissue]
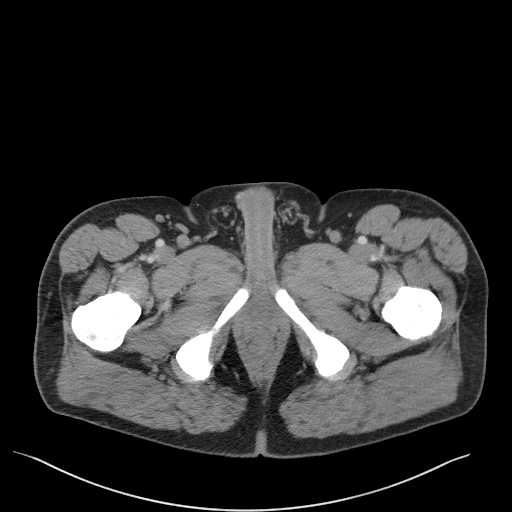
[im 7/99  bone]
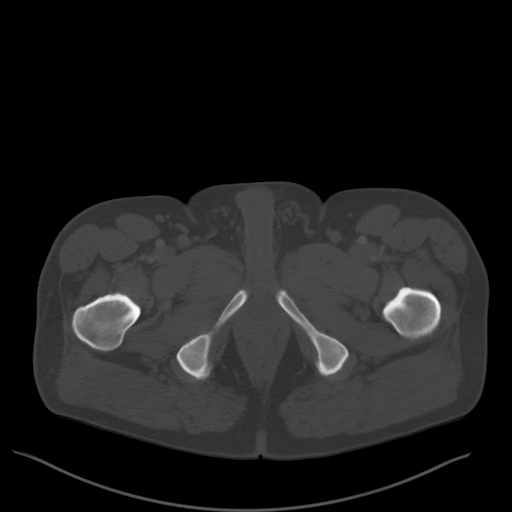
[im 13/99  soft-tissue]
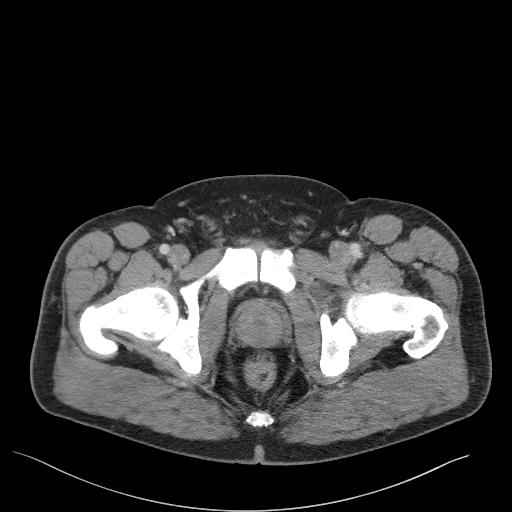
[im 19/99  soft-tissue]
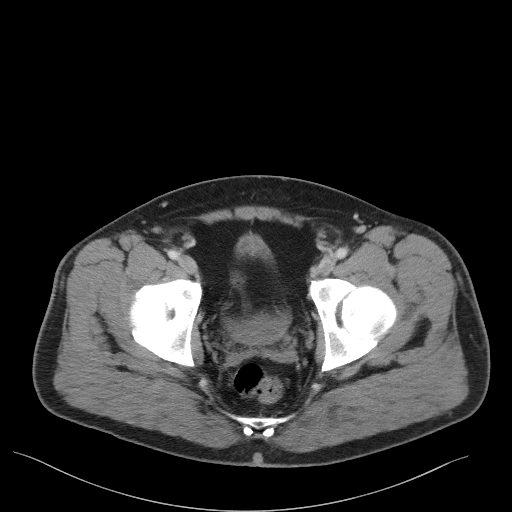
[im 31/99  soft-tissue]
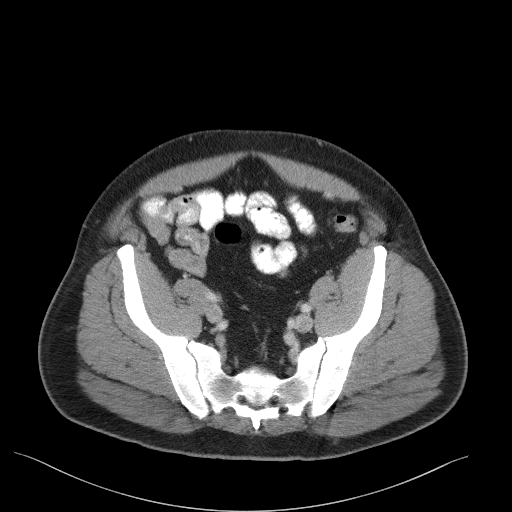
[im 37/99  soft-tissue]
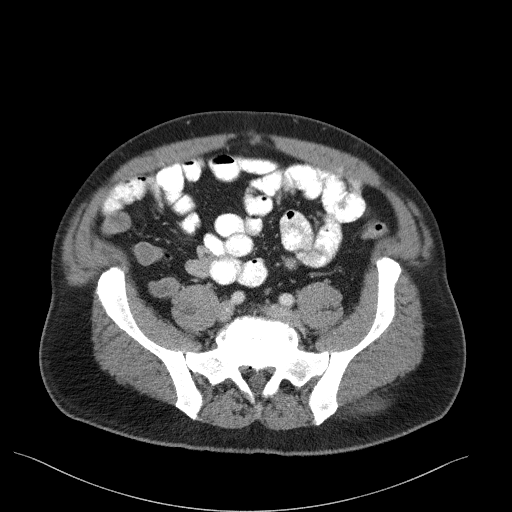
[im 43/99  soft-tissue]
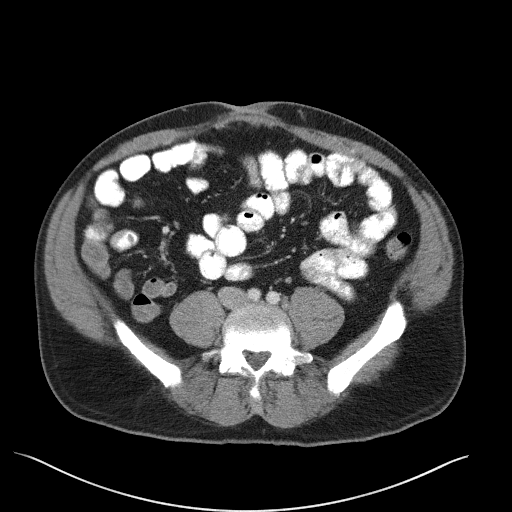
[im 50/99  soft-tissue]
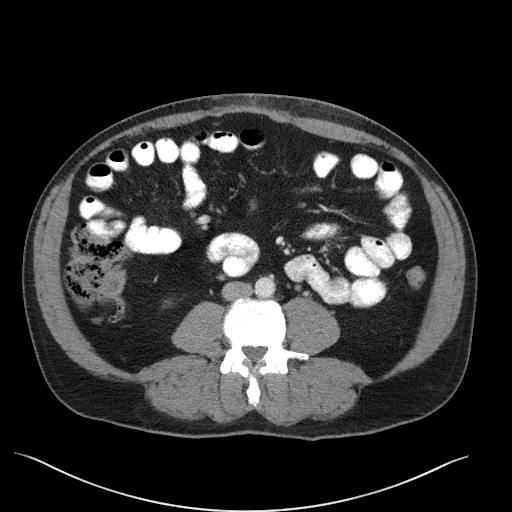
[im 56/99  soft-tissue]
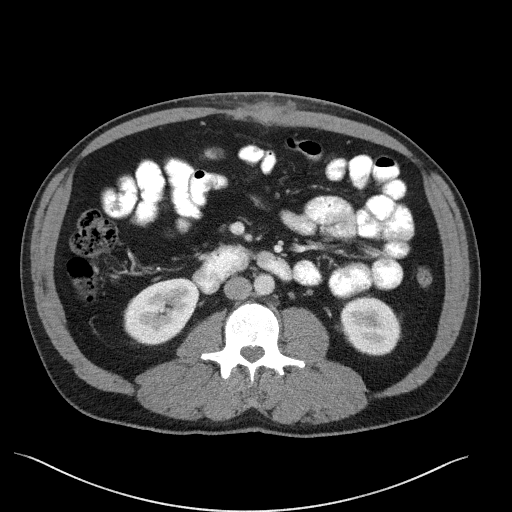
[im 62/99  soft-tissue]
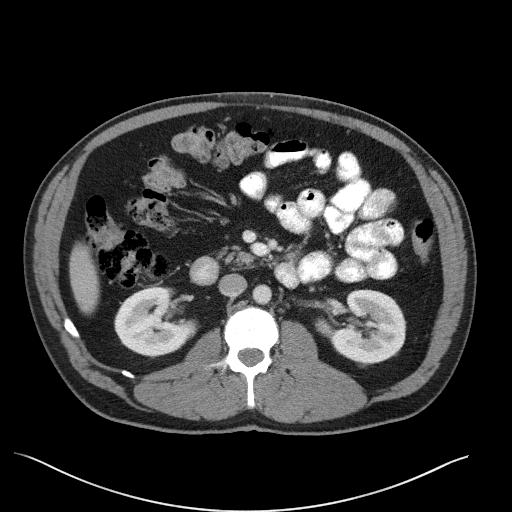
[im 62/99  bone]
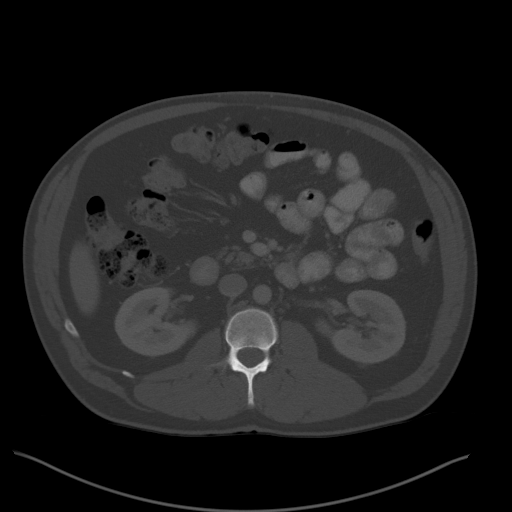
[im 68/99  soft-tissue]
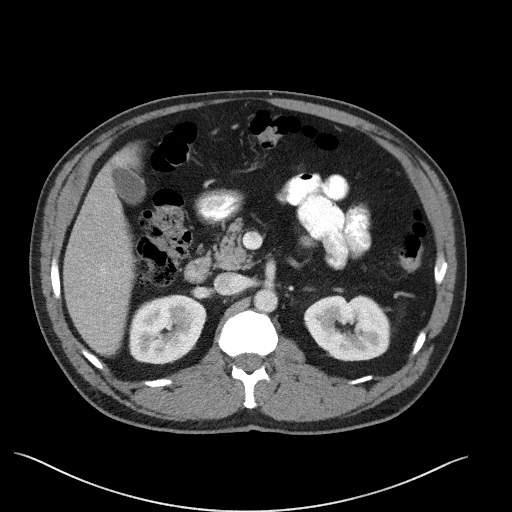
[im 80/99  soft-tissue]
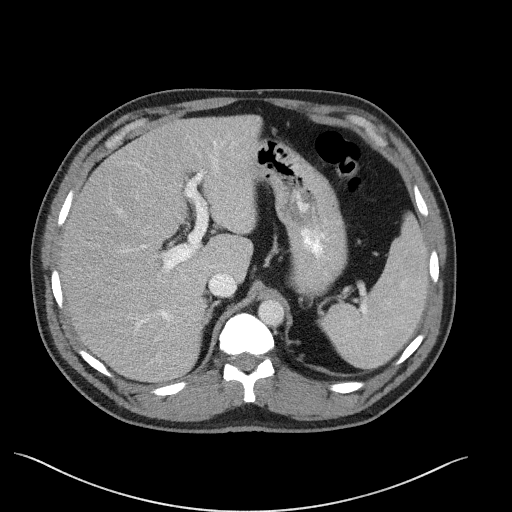
[im 86/99  soft-tissue]
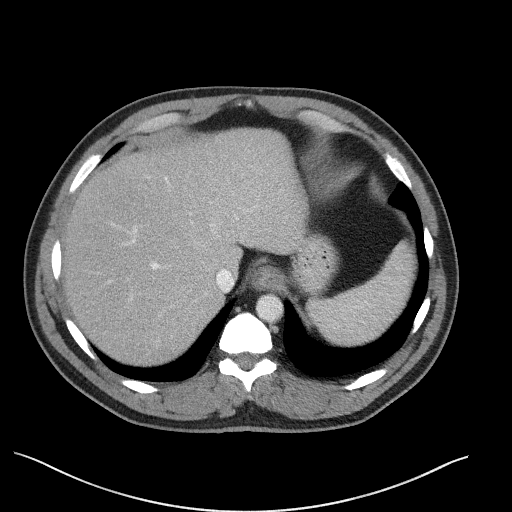
[im 92/99  soft-tissue]
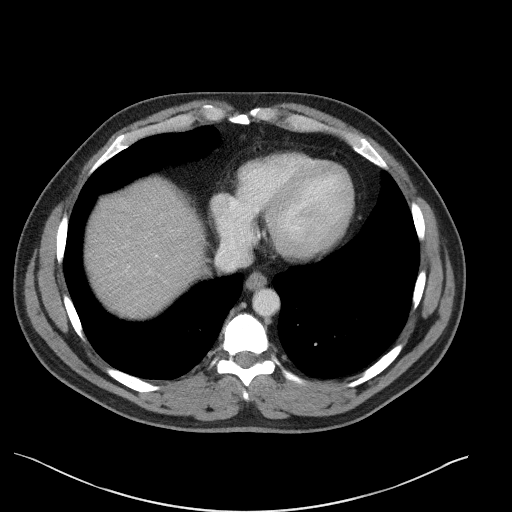

[Series 5: coronal st · coronal · 0.86mm/px · 3 of 99 slices shown]
[im 33/99  soft-tissue]
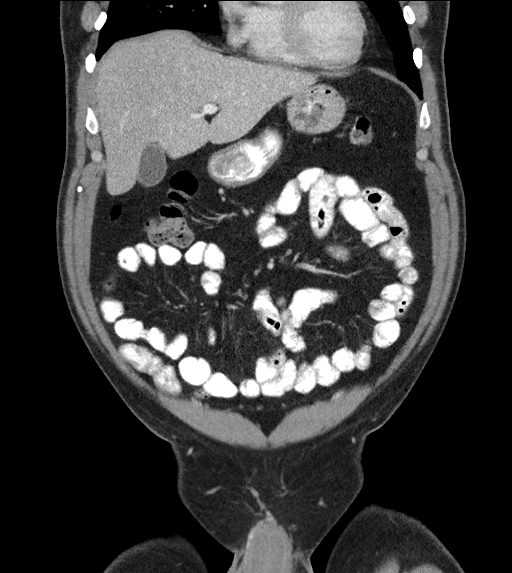
[im 44/99  soft-tissue]
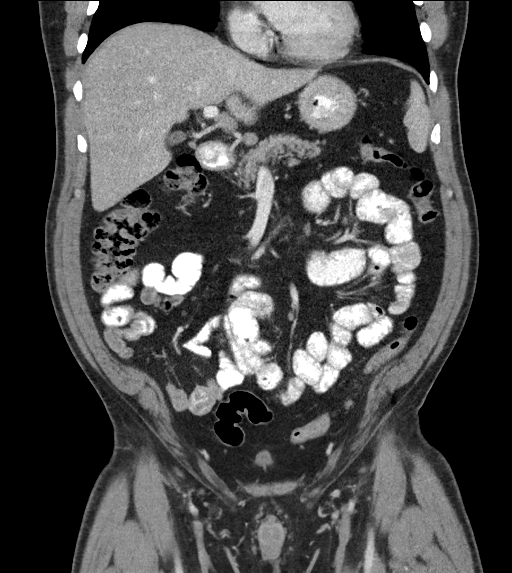
[im 55/99  soft-tissue]
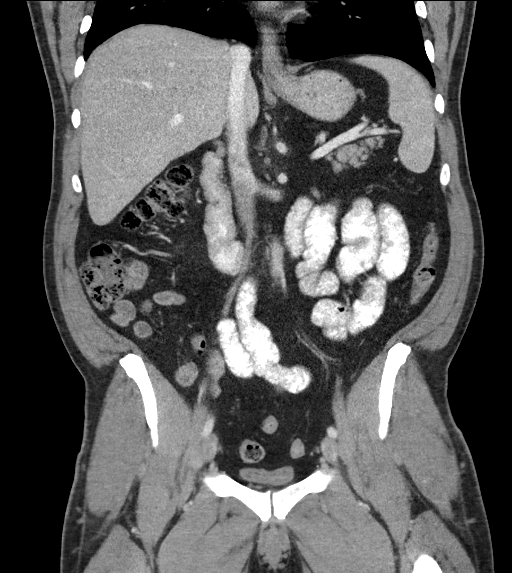

[16 of 46 positions shown; findings below may reference images not displayed]

FINDINGS: Lower chest: No acute abnormality.

Hepatobiliary: No focal liver abnormality is seen. No gallstones,
gallbladder wall thickening, or biliary dilatation.

Pancreas: Unremarkable. No pancreatic ductal dilatation or
surrounding inflammatory changes.

Spleen: Normal in size without focal abnormality.

Adrenals/Urinary Tract: Adrenal glands are unremarkable. There is no
hydronephrosis bilaterally. There is a small cyst in the left
kidney. The right kidney is normal. The bladder is normal.

Stomach/Bowel: Stomach is within normal limits. Appendix appears
normal. No evidence of bowel wall thickening, distention, or
inflammatory changes.

Vascular/Lymphatic: No significant vascular findings are present. No
enlarged abdominal or pelvic lymph nodes.

Reproductive: Prostate is unremarkable.

Other: In the midline palpable area supraumbilical region, there is
a 3.9 x 2.2 cm soft tissue density in abdominal wall and
subcutaneous fat.

Musculoskeletal: Degenerative joint changes of L5-S1 with narrowed
joint space and osteophyte formation are noted.
IMPRESSION: In the midline palpable area supraumbilical region, there is a 3.9 x
2.2 cm soft tissue density in abdominal wall and subcutaneous fat.
This is nonspecific. This could represent scar from prior surgery
but underlying mass is not excluded. There is no evidence of
abscess.

## 2019-12-26 MED FILL — LOVASTATIN 20 MG TABS: 20 | 90 days supply | Qty: 90 | Fill #0

## 2020-02-28 ENCOUNTER — Encounter: Payer: Self-pay | Admitting: General Surgery

## 2020-02-28 ENCOUNTER — Other Ambulatory Visit: Payer: Self-pay

## 2020-02-28 ENCOUNTER — Ambulatory Visit (INDEPENDENT_AMBULATORY_CARE_PROVIDER_SITE_OTHER): Payer: No Typology Code available for payment source | Admitting: General Surgery

## 2020-02-28 VITALS — BP 108/74 | HR 86 | Temp 98.0°F | Resp 12 | Ht 71.0 in | Wt 212.0 lb

## 2020-02-28 DIAGNOSIS — T8189XD Other complications of procedures, not elsewhere classified, subsequent encounter: Secondary | ICD-10-CM | POA: Diagnosis not present

## 2020-02-29 NOTE — Progress Notes (Signed)
Subjective:     Marvin Tapia  Patient is here for follow-up wound check.  Does have a small no purulent drainage from a granuloma of the abdominal wall.  I have seen him in the past for suture granulomas that developed from an incisional herniorrhaphy with mesh done many years ago.  He denies any fever or chills.  The new fistula is just medial to the previously removed monofilament blue suture.  He does not see another suture. Objective:    BP 108/74   Pulse 86   Temp 98 F (36.7 C) (Oral)   Resp 12   Ht 5\' 11"  (1.803 m)   Wt 212 lb (96.2 kg)   SpO2 97%   BMI 29.57 kg/m   General:  alert, cooperative and no distress  Abdomen is soft.  1.5 cm indurated area in the upper abdomen.  Eschar present and removed.  No suture seen.  MInimal purulent drainage.     Assessment:    Cellulitis of abdominal wall secondary to granuloma    Plan:    Continue keeping wound clean and dry.  May end up needing exploration. Will see again in three weeks.

## 2020-03-17 MED FILL — LOVASTATIN 20 MG TABS: 20 | 90 days supply | Qty: 90 | Fill #1

## 2020-03-17 MED FILL — LISINOPRIL 20 MG TABLET: 20 | 90 days supply | Qty: 90 | Fill #1

## 2020-03-17 MED FILL — METFORMIN HCL 1000 MG TABS: 1000 | 90 days supply | Qty: 180 | Fill #3

## 2020-05-15 ENCOUNTER — Other Ambulatory Visit: Payer: Self-pay

## 2020-05-15 ENCOUNTER — Encounter: Payer: Self-pay | Admitting: Internal Medicine

## 2020-05-15 ENCOUNTER — Ambulatory Visit (INDEPENDENT_AMBULATORY_CARE_PROVIDER_SITE_OTHER): Payer: No Typology Code available for payment source | Admitting: Internal Medicine

## 2020-05-15 VITALS — BP 124/82 | HR 82 | Temp 98.0°F | Ht 70.5 in | Wt 210.0 lb

## 2020-05-15 DIAGNOSIS — K219 Gastro-esophageal reflux disease without esophagitis: Secondary | ICD-10-CM | POA: Insufficient documentation

## 2020-05-15 DIAGNOSIS — E119 Type 2 diabetes mellitus without complications: Secondary | ICD-10-CM

## 2020-05-15 DIAGNOSIS — E7800 Pure hypercholesterolemia, unspecified: Secondary | ICD-10-CM

## 2020-05-15 DIAGNOSIS — I10 Essential (primary) hypertension: Secondary | ICD-10-CM | POA: Diagnosis not present

## 2020-05-15 LAB — COMPREHENSIVE METABOLIC PANEL
ALT: 18 U/L (ref 0–53)
AST: 20 U/L (ref 0–37)
Albumin: 4.2 g/dL (ref 3.5–5.2)
Alkaline Phosphatase: 75 U/L (ref 39–117)
BUN: 17 mg/dL (ref 6–23)
CO2: 31 mEq/L (ref 19–32)
Calcium: 9.3 mg/dL (ref 8.4–10.5)
Chloride: 105 mEq/L (ref 96–112)
Creatinine, Ser: 0.88 mg/dL (ref 0.40–1.50)
GFR: 91.29 mL/min (ref >60.00)
Glucose, Bld: 123 mg/dL — ABNORMAL HIGH (ref 70–99)
Potassium: 4 mEq/L (ref 3.5–5.1)
Sodium: 139 mEq/L (ref 135–145)
Total Bilirubin: 0.4 mg/dL (ref 0.2–1.2)
Total Protein: 6.8 g/dL (ref 6.0–8.3)

## 2020-05-15 LAB — LIPID PANEL
Cholesterol: 201 mg/dL — ABNORMAL HIGH (ref 0–200)
HDL: 32.5 mg/dL — ABNORMAL LOW (ref >39.00)
Total CHOL/HDL Ratio: 6
Triglycerides: 403 mg/dL — ABNORMAL HIGH (ref 0.0–149.0)

## 2020-05-15 LAB — LDL CHOLESTEROL, DIRECT: Direct LDL: 120 mg/dL

## 2020-05-15 LAB — HEMOGLOBIN A1C: Hgb A1c MFr Bld: 6.3 % (ref 4.6–6.5)

## 2020-05-15 NOTE — Assessment & Plan Note (Signed)
CBC and CMET today Continue Pantoprazole  Will monitor 

## 2020-05-15 NOTE — Assessment & Plan Note (Signed)
CMET and Lipid profile today Encouraged him to consume a low fat diet Continue Lovastatin

## 2020-05-15 NOTE — Assessment & Plan Note (Signed)
Controlled on Lisinopril CMET today Will monitor 

## 2020-05-15 NOTE — Patient Instructions (Signed)

## 2020-05-15 NOTE — Progress Notes (Addendum)
HPI  Pt presents to the clinic today to establish care and for management of the conditions listed below. He is transferring care from Dr. Luan Pulling.  HLD: His last LDL was 110, 06/2019. He denies myalgias on Lovastatin. He tries to consume a low fat diet.  DM 2: His last A1C was 6.1%, 06/2019. He is taking Metformin as prescribed. He does not check his sugars routinely. He does not check his feet. His last eye exam was more than 2 years ago.  HTN: His BP today is 124/82. He is taking Lisinopril as prescribed. There is no ECG on file.  GERD: He is not sure what triggers this. He denies breakthrough on Pantoprazole. He has never had an upper GI.  Flu: never Tetanus: never Pneumovax: never Covid: never PSA Screening: Colon Screening: never Vision Screening: as needed Dentist: dentures  No past medical history on file.  Current Outpatient Medications  Medication Sig Dispense Refill  . ACCU-CHEK GUIDE test strip     . lisinopril (PRINIVIL,ZESTRIL) 20 MG tablet Take 20 mg by mouth daily.    Marland Kitchen lovastatin (MEVACOR) 20 MG tablet Take 20 mg by mouth at bedtime.    . metFORMIN (GLUCOPHAGE) 1000 MG tablet      No current facility-administered medications for this visit.    Allergies  Allergen Reactions  . Sulfa Antibiotics     No family history on file.  Social History   Socioeconomic History  . Marital status: Married    Spouse name: Not on file  . Number of children: Not on file  . Years of education: Not on file  . Highest education level: Not on file  Occupational History  . Not on file  Tobacco Use  . Smoking status: Current Some Day Smoker    Packs/day: 0.50    Types: Cigarettes  . Smokeless tobacco: Never Used  Substance and Sexual Activity  . Alcohol use: Not Currently    Alcohol/week: 0.0 standard drinks  . Drug use: Never  . Sexual activity: Not on file  Other Topics Concern  . Not on file  Social History Narrative  . Not on file   Social Determinants of  Health   Financial Resource Strain:   . Difficulty of Paying Living Expenses:   Food Insecurity:   . Worried About Charity fundraiser in the Last Year:   . Arboriculturist in the Last Year:   Transportation Needs:   . Film/video editor (Medical):   Marland Kitchen Lack of Transportation (Non-Medical):   Physical Activity:   . Days of Exercise per Week:   . Minutes of Exercise per Session:   Stress:   . Feeling of Stress :   Social Connections:   . Frequency of Communication with Friends and Family:   . Frequency of Social Gatherings with Friends and Family:   . Attends Religious Services:   . Active Member of Clubs or Organizations:   . Attends Archivist Meetings:   Marland Kitchen Marital Status:   Intimate Partner Violence:   . Fear of Current or Ex-Partner:   . Emotionally Abused:   Marland Kitchen Physically Abused:   . Sexually Abused:     ROS:  Constitutional: Denies fever, malaise, fatigue, headache or abrupt weight changes.  HEENT: Denies eye pain, eye redness, ear pain, ringing in the ears, wax buildup, runny nose, nasal congestion, bloody nose, or sore throat. Respiratory: Denies difficulty breathing, shortness of breath, cough or sputum production.   Cardiovascular: Denies chest  pain, chest tightness, palpitations or swelling in the hands or feet.  Gastrointestinal: Denies abdominal pain, bloating, constipation, diarrhea or blood in the stool.  GU: Denies frequency, urgency, pain with urination, blood in urine, odor or discharge. Musculoskeletal: Denies decrease in range of motion, difficulty with gait, muscle pain or joint pain and swelling.  Skin: Denies redness, rashes, lesions or ulcercations.  Neurological: Denies dizziness, difficulty with memory, difficulty with speech or problems with balance and coordination.  Psych: Denies anxiety, depression, SI/HI.  No other specific complaints in a complete review of systems (except as listed in HPI above).  PE:  BP 124/82   Pulse 82    Temp 98 F (36.7 C) (Temporal)   Ht 5' 10.5" (1.791 m)   Wt 210 lb (95.3 kg)   SpO2 96%   BMI 29.71 kg/m   Wt Readings from Last 3 Encounters:  02/28/20 212 lb (96.2 kg)  12/18/19 214 lb (97.1 kg)  11/01/19 216 lb (98 kg)    General: Appears his stated age, well developed, well nourished in NAD. HEENT: Head: normal shape and size; Eyes: sclera white, no icterus, conjunctiva pink, PERRLA and EOMs intact;  Neck: Neck supple, trachea midline. No masses, lumps or thyromegaly present.  Cardiovascular: Normal rate and rhythm. S1,S2 noted.  No murmur, rubs or gallops noted. No JVD or BLE edema. No carotid bruits noted. Pulmonary/Chest: Normal effort and positive vesicular breath sounds. No respiratory distress. No wheezes, rales or ronchi noted.  Abdomen: Soft and nontender.  Musculoskeletal:  No difficulty with gait.  Neurological: Alert and oriented.  Psychiatric: Mood and affect normal. Behavior is normal. Judgment and thought content normal.     BMET    Component Value Date/Time   NA 139 07/09/2019 1101   K 4.2 07/09/2019 1101   CL 103 07/09/2019 1101   CO2 26 07/09/2019 1101   GLUCOSE 103 (H) 07/09/2019 1101   BUN 19 07/09/2019 1101   CREATININE 0.77 07/09/2019 1101   CALCIUM 9.4 07/09/2019 1101   GFRNONAA >60 07/09/2019 1101   GFRAA >60 07/09/2019 1101    Lipid Panel     Component Value Date/Time   CHOL 186 07/09/2019 1102   TRIG 207 (H) 07/09/2019 1102   HDL 35 (L) 07/09/2019 1102   CHOLHDL 5.3 07/09/2019 1102   VLDL 41 (H) 07/09/2019 1102   LDLCALC 110 (H) 07/09/2019 1102    CBC    Component Value Date/Time   WBC 12.7 (H) 09/11/2011 0513   RBC 4.65 09/11/2011 0513   HGB 14.7 09/11/2011 0513   HCT 39.2 09/11/2011 0513   PLT 212 09/11/2011 0513   MCV 84.3 09/11/2011 0513   MCH 31.6 09/11/2011 0513   MCHC 37.5 (H) 09/11/2011 0513   RDW 12.6 09/11/2011 0513   LYMPHSABS 4.4 (H) 09/11/2011 0513   MONOABS 1.2 (H) 09/11/2011 0513   EOSABS 0.2 09/11/2011 0513    BASOSABS 0.0 09/11/2011 0513    Hgb A1C Lab Results  Component Value Date   HGBA1C 6.1 (H) 07/09/2019     Assessment and Plan:   Nicki Reaper, NP This visit occurred during the SARS-CoV-2 public health emergency.  Safety protocols were in place, including screening questions prior to the visit, additional usage of staff PPE, and extensive cleaning of exam room while observing appropriate contact time as indicated for disinfecting solutions.

## 2020-05-15 NOTE — Assessment & Plan Note (Signed)
A1C today No urine micro albumin secondary to ACEI therapy Encouraged him to consume a low carb diet, exercise for weight loss Referral to ophthalmology for eye exam Encouraged routine foot exams He declines immunizations

## 2020-05-16 ENCOUNTER — Encounter: Payer: Self-pay | Admitting: Internal Medicine

## 2020-05-16 DIAGNOSIS — E7800 Pure hypercholesterolemia, unspecified: Secondary | ICD-10-CM

## 2020-05-20 ENCOUNTER — Encounter: Payer: Self-pay | Admitting: Internal Medicine

## 2020-05-20 MED ORDER — PANTOPRAZOLE SODIUM 40 MG PO TBEC: 40.0000 mg | DELAYED_RELEASE_TABLET | Freq: Every day | ORAL | 0 refills | Status: DC

## 2020-05-20 MED FILL — PANTOPRAZOLE SOD DR 40 MG T: 40 | 90 days supply | Qty: 90 | Fill #0

## 2020-05-22 MED ORDER — ATORVASTATIN CALCIUM 20 MG PO TABS: 20.0000 mg | ORAL_TABLET | Freq: Every day | ORAL | 2 refills | Status: DC

## 2020-05-22 MED FILL — ATORVASTATIN 20 MG TABLET: 20 | 30 days supply | Qty: 30 | Fill #0

## 2020-06-23 MED FILL — LISINOPRIL 20 MG TABLET: 20 | 90 days supply | Qty: 90 | Fill #2

## 2020-06-26 ENCOUNTER — Encounter: Payer: Self-pay | Admitting: Internal Medicine

## 2020-06-26 LAB — HM DIABETES EYE EXAM

## 2020-07-02 ENCOUNTER — Other Ambulatory Visit: Payer: Self-pay

## 2020-07-03 ENCOUNTER — Ambulatory Visit (INDEPENDENT_AMBULATORY_CARE_PROVIDER_SITE_OTHER): Payer: No Typology Code available for payment source | Admitting: Internal Medicine

## 2020-07-03 ENCOUNTER — Encounter: Payer: Self-pay | Admitting: Internal Medicine

## 2020-07-03 VITALS — BP 128/84 | HR 89 | Temp 98.2°F | Ht 70.5 in | Wt 211.0 lb

## 2020-07-03 DIAGNOSIS — Z0001 Encounter for general adult medical examination with abnormal findings: Secondary | ICD-10-CM

## 2020-07-03 DIAGNOSIS — Z0000 Encounter for general adult medical examination without abnormal findings: Secondary | ICD-10-CM | POA: Diagnosis not present

## 2020-07-03 NOTE — Progress Notes (Signed)
Subjective:    Patient ID: Marvin Tapia, male    DOB: 1971-02-27, 49 y.o.   MRN: [id number]  HPI  Patient presents the clinic today for his annual exam.  He reports he had diarrhea with Atorvastatin. He has stopped this and gone back on the Lovastatin.  Flu: Never Tetanus: Unsure Pneumovax: Never Covid: Never PSA screening: Never Colon screening: Never Vision screening: as needed Dentist: dentures  Diet: He does eat meat. He consumes fruits and veggies daily. He tries to avoid fried foods. He drinks mostly Gatorade/Poweraid. Exercise: None  Review of Systems      Past Medical History:  Diagnosis Date  . Diabetes mellitus without complication (HCC)   . GERD (gastroesophageal reflux disease)   . Hyperlipidemia   . Hypertension     Current Outpatient Medications  Medication Sig Dispense Refill  . ACCU-CHEK GUIDE test strip     . lisinopril (PRINIVIL,ZESTRIL) 20 MG tablet Take 20 mg by mouth daily.    Marland Kitchen lovastatin (MEVACOR) 20 MG tablet Take 20 mg by mouth at bedtime.    . metFORMIN (GLUCOPHAGE) 1000 MG tablet     . pantoprazole (PROTONIX) 40 MG tablet Take 1 tablet (40 mg total) by mouth daily. 90 tablet 0  . atorvastatin (LIPITOR) 20 MG tablet Take 1 tablet (20 mg total) by mouth daily. (Patient not taking: Reported on 07/03/2020) 30 tablet 2   No current facility-administered medications for this visit.    Allergies  Allergen Reactions  . Sulfa Antibiotics     Family History  Problem Relation Age of Onset  . Heart disease Father   . Diabetes Brother   . Hypertension Brother     Social History   Socioeconomic History  . Marital status: Married    Spouse name: Not on file  . Number of children: Not on file  . Years of education: Not on file  . Highest education level: Not on file  Occupational History  . Not on file  Tobacco Use  . Smoking status: Current Some Day Smoker    Packs/day: 1.00    Types: Cigarettes  . Smokeless tobacco: Never Used    Substance and Sexual Activity  . Alcohol use: Not Currently    Alcohol/week: 0.0 standard drinks  . Drug use: Never  . Sexual activity: Yes  Other Topics Concern  . Not on file  Social History Narrative  . Not on file   Social Determinants of Health   Financial Resource Strain:   . Difficulty of Paying Living Expenses:   Food Insecurity:   . Worried About Programme researcher, broadcasting/film/video in the Last Year:   . Barista in the Last Year:   Transportation Needs:   . Freight forwarder (Medical):   Marland Kitchen Lack of Transportation (Non-Medical):   Physical Activity:   . Days of Exercise per Week:   . Minutes of Exercise per Session:   Stress:   . Feeling of Stress :   Social Connections:   . Frequency of Communication with Friends and Family:   . Frequency of Social Gatherings with Friends and Family:   . Attends Religious Services:   . Active Member of Clubs or Organizations:   . Attends Banker Meetings:   Marland Kitchen Marital Status:   Intimate Partner Violence:   . Fear of Current or Ex-Partner:   . Emotionally Abused:   Marland Kitchen Physically Abused:   . Sexually Abused:      Constitutional: Denies  fever, malaise, fatigue, headache or abrupt weight changes.  HEENT: Denies eye pain, eye redness, ear pain, ringing in the ears, wax buildup, runny nose, nasal congestion, bloody nose, or sore throat. Respiratory: Denies difficulty breathing, shortness of breath, cough or sputum production.   Cardiovascular: Denies chest pain, chest tightness, palpitations or swelling in the hands or feet.  Gastrointestinal: Denies abdominal pain, bloating, constipation, diarrhea or blood in the stool.  GU: Denies urgency, frequency, pain with urination, burning sensation, blood in urine, odor or discharge. Musculoskeletal: Pt reports intermittent muscle cramps in legs. Denies decrease in range of motion, difficulty with gait, or joint pain and swelling.  Skin: Denies redness, rashes, lesions or  ulcercations.  Neurological: Denies dizziness, difficulty with memory, difficulty with speech or problems with balance and coordination.  Psych: Denies anxiety, depression, SI/HI.  No other specific complaints in a complete review of systems (except as listed in HPI above).  Objective:   Physical Exam  BP 128/84   Pulse 89   Temp 98.2 F (36.8 C) (Temporal)   Ht 5' 10.5" (1.791 m)   Wt 211 lb (95.7 kg)   SpO2 98%   BMI 29.85 kg/m   Wt Readings from Last 3 Encounters:  05/15/20 210 lb (95.3 kg)  02/28/20 212 lb (96.2 kg)  12/18/19 214 lb (97.1 kg)    General: Appears his stated age, well developed, well nourished in NAD. Skin: Warm, dry and intact. Multiple scratches to bilateral forearms and legs- new puppy! HEENT: Head: Marvin shape and size; Eyes: sclera white, no icterus, conjunctiva pink, PERRLA and EOMs intact;  Neck:  Neck supple, trachea midline. No masses, lumps or thyromegaly present.  Cardiovascular: Marvin rate and rhythm. S1,S2 noted.  No murmur, rubs or gallops noted. No JVD or BLE edema. No carotid bruits noted. Pulmonary/Chest: Marvin effort and positive vesicular breath sounds. No respiratory distress. No wheezes, rales or ronchi noted.  Abdomen: Soft and nontender. Marvin bowel sounds. Ventral hernia noted. Liver, spleen and kidneys non palpable. Musculoskeletal: Strength 5/5 BUE/BLE. No difficulty with gait.  Neurological: Alert and oriented. Cranial nerves II-XII grossly intact. Coordination Marvin.  Psychiatric: Mood and affect Marvin. Behavior is Marvin. Judgment and thought content Marvin.     BMET    Component Value Date/Time   NA 139 05/15/2020 1418   K 4.0 05/15/2020 1418   CL 105 05/15/2020 1418   CO2 31 05/15/2020 1418   GLUCOSE 123 (H) 05/15/2020 1418   BUN 17 05/15/2020 1418   CREATININE 0.88 05/15/2020 1418   CALCIUM 9.3 05/15/2020 1418   GFRNONAA >60 07/09/2019 1101   GFRAA >60 07/09/2019 1101    Lipid Panel     Component Value  Date/Time   CHOL 201 (H) 05/15/2020 1418   TRIG (H) 05/15/2020 1418    403.0 Triglyceride is over 400; calculations on Lipids are invalid.   HDL 32.50 (L) 05/15/2020 1418   CHOLHDL 6 05/15/2020 1418   VLDL 41 (H) 07/09/2019 1102   LDLCALC 110 (H) 07/09/2019 1102    CBC    Component Value Date/Time   WBC 12.7 (H) 09/11/2011 0513   RBC 4.65 09/11/2011 0513   HGB 14.7 09/11/2011 0513   HCT 39.2 09/11/2011 0513   PLT 212 09/11/2011 0513   MCV 84.3 09/11/2011 0513   MCH 31.6 09/11/2011 0513   MCHC 37.5 (H) 09/11/2011 0513   RDW 12.6 09/11/2011 0513   LYMPHSABS 4.4 (H) 09/11/2011 0513   MONOABS 1.2 (H) 09/11/2011 0513   EOSABS 0.2  09/11/2011 0513   BASOSABS 0.0 09/11/2011 0513    Hgb A1C Lab Results  Component Value Date   HGBA1C 6.3 05/15/2020           Assessment & Plan:   Preventative health maintenance:  Encouraged him to get a flu shot in the fall He declines tetanus vaccine today He declines Pneumovax today He declines Covid vaccine He declines colon cancer screening He declines prostate cancer screening Encouraged him to consume a balanced diet exercise regimen Advised him to see an eye doctor and dentist annually Labs from 04/2020 reviewed  RTC in 6 weeks for repeat labs Nicki Reaper, NP This visit occurred during the SARS-CoV-2 public health emergency.  Safety protocols were in place, including screening questions prior to the visit, additional usage of staff PPE, and extensive cleaning of exam room while observing appropriate contact time as indicated for disinfecting solutions.

## 2020-07-03 NOTE — Patient Instructions (Signed)

## 2020-08-18 ENCOUNTER — Other Ambulatory Visit: Payer: Self-pay | Admitting: Internal Medicine

## 2020-08-20 ENCOUNTER — Other Ambulatory Visit: Payer: Self-pay | Admitting: Internal Medicine

## 2020-08-20 MED FILL — PANTOPRAZOLE SOD DR 40 MG T: 40 | 90 days supply | Qty: 90 | Fill #0

## 2020-08-25 ENCOUNTER — Other Ambulatory Visit (INDEPENDENT_AMBULATORY_CARE_PROVIDER_SITE_OTHER): Payer: No Typology Code available for payment source

## 2020-08-25 ENCOUNTER — Other Ambulatory Visit: Payer: Self-pay

## 2020-08-25 ENCOUNTER — Encounter: Payer: Self-pay | Admitting: Internal Medicine

## 2020-08-25 DIAGNOSIS — Z0001 Encounter for general adult medical examination with abnormal findings: Secondary | ICD-10-CM | POA: Diagnosis not present

## 2020-08-25 DIAGNOSIS — E7800 Pure hypercholesterolemia, unspecified: Secondary | ICD-10-CM | POA: Diagnosis not present

## 2020-08-25 LAB — COMPREHENSIVE METABOLIC PANEL
ALT: 17 U/L (ref 0–53)
AST: 20 U/L (ref 0–37)
Albumin: 4.2 g/dL (ref 3.5–5.2)
Alkaline Phosphatase: 73 U/L (ref 39–117)
BUN: 15 mg/dL (ref 6–23)
CO2: 29 mEq/L (ref 19–32)
Calcium: 9.5 mg/dL (ref 8.4–10.5)
Chloride: 104 mEq/L (ref 96–112)
Creatinine, Ser: 0.86 mg/dL (ref 0.40–1.50)
GFR: 93.64 mL/min (ref >60.00)
Glucose, Bld: 126 mg/dL — ABNORMAL HIGH (ref 70–99)
Potassium: 4.1 mEq/L (ref 3.5–5.1)
Sodium: 139 mEq/L (ref 135–145)
Total Bilirubin: 0.4 mg/dL (ref 0.2–1.2)
Total Protein: 7 g/dL (ref 6.0–8.3)

## 2020-08-25 LAB — HEMOGLOBIN A1C: Hgb A1c MFr Bld: 6.4 % (ref 4.6–6.5)

## 2020-08-25 LAB — LIPID PANEL
Cholesterol: 207 mg/dL — ABNORMAL HIGH (ref 0–200)
HDL: 33.7 mg/dL — ABNORMAL LOW (ref >39.00)
NonHDL: 173.68
Total CHOL/HDL Ratio: 6
Triglycerides: 219 mg/dL — ABNORMAL HIGH (ref 0.0–149.0)
VLDL: 43.8 mg/dL — ABNORMAL HIGH (ref 0.0–40.0)

## 2020-08-25 LAB — LDL CHOLESTEROL, DIRECT: Direct LDL: 133 mg/dL

## 2020-08-27 MED ORDER — LOVASTATIN 40 MG PO TABS: 40.0000 mg | ORAL_TABLET | Freq: Every day | ORAL | 0 refills | Status: DC

## 2020-08-27 MED FILL — LOVASTATIN 40 MG TABS: 40 | 90 days supply | Qty: 90 | Fill #0

## 2020-09-02 ENCOUNTER — Encounter: Payer: Self-pay | Admitting: General Surgery

## 2020-09-02 ENCOUNTER — Ambulatory Visit (INDEPENDENT_AMBULATORY_CARE_PROVIDER_SITE_OTHER): Payer: No Typology Code available for payment source | Admitting: General Surgery

## 2020-09-02 ENCOUNTER — Other Ambulatory Visit: Payer: Self-pay

## 2020-09-02 VITALS — BP 117/81 | HR 89 | Temp 98.2°F | Resp 16 | Ht 71.0 in | Wt 212.0 lb

## 2020-09-02 DIAGNOSIS — T8189XD Other complications of procedures, not elsewhere classified, subsequent encounter: Secondary | ICD-10-CM | POA: Diagnosis not present

## 2020-09-02 MED ORDER — DOXYCYCLINE HYCLATE 50 MG PO CAPS: 100.0000 mg | ORAL_CAPSULE | Freq: Two times a day (BID) | ORAL | 0 refills | Status: DC

## 2020-09-02 MED FILL — DOXYCYCLINE HYCLATE 100 MG: 100 | 10 days supply | Qty: 20 | Fill #0

## 2020-09-02 NOTE — Progress Notes (Signed)
Subjective:     Marvin Tapia  Presents back with drainage and erythema around the upper portion of his abdominal wall at the edge of the mesh.  This is the same spot which he has been dealing with for years.  Is always felt that he has had a suture granuloma.  He has not had it explored.  He is now ready to have it explored.  He states he has been draining for the past few months.  He denies any fevers. Objective:    BP 117/81   Pulse 89   Temp 98.2 F (36.8 C)   Resp 16   Ht 5\' 11"  (1.803 m)   Wt 212 lb (96.2 kg)   SpO2 97%   BMI 29.57 kg/m   General:  alert, cooperative and no distress  Abdomen is soft.  Well-healed surgical scars noted in the periumbilical region.  Superior and just to the left of this is a chronic wound with induration and erythema present in a linear fashion across the upper portion of the abdomen.  No fluctuance is present.  A scab is present over the wound.  The wound is approximately 5 mm in size.     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    Plan:   Doxycycline 100 mg p.o. twice daily x10 days.  Patient will return in 2 weeks to schedule surgical intervention.

## 2020-09-16 ENCOUNTER — Ambulatory Visit (INDEPENDENT_AMBULATORY_CARE_PROVIDER_SITE_OTHER): Payer: No Typology Code available for payment source | Admitting: General Surgery

## 2020-09-16 ENCOUNTER — Other Ambulatory Visit: Payer: Self-pay

## 2020-09-16 ENCOUNTER — Encounter: Payer: Self-pay | Admitting: General Surgery

## 2020-09-16 VITALS — BP 133/85 | HR 67 | Temp 98.2°F | Resp 16 | Ht 71.0 in | Wt 208.0 lb

## 2020-09-16 DIAGNOSIS — T8189XD Other complications of procedures, not elsewhere classified, subsequent encounter: Secondary | ICD-10-CM

## 2020-09-16 NOTE — Progress Notes (Signed)
Subjective:     Marvin Tapia  Here for follow-up wound check.  Still is having some cloudy drainage from the wound.  Denies any fevers. Objective:    BP 133/85   Pulse 67   Temp 98.2 F (36.8 C) (Oral)   Resp 16   Ht 5\' 11"  (1.803 m)   Wt 208 lb (94.3 kg)   SpO2 96%   BMI 29.01 kg/m   General:  alert, cooperative and no distress  Abdomen is soft with an open wound superior to the umbilicus and to the left.  There does seem to be some induration extending down to the midline area around an incision.  Dry skin present around the wound.  Less erythema present.     Assessment:    Cellulitis of abdominal wall, possibly secondary to suture granuloma.  Infected underlying mesh less likely.    Plan:   Patient is scheduled for exploration of the wound, removal of suture granuloma on 10/01/2020.  The risks and benefits of the procedure including bleeding, infection, and recurrence of the wound as well as the possibility of leaving the wound open were fully explained to the patient, who gave informed consent.

## 2020-09-18 NOTE — H&P (Signed)
Marvin Tapia is an 49 y.o. male.   Chief Complaint: Recurrent cellulitis, abdominal wall HPI: Patient is a 49 year old white male status post incisional herniorrhaphy with mesh in the remote past who has had recurrent episodes of abdominal wall cellulitis most likely secondary to a suture granuloma.  He has been treated for this with antibiotics for many years.  Due to the recurrent nature, he now presents for wound exploration.  Past Medical History:  Diagnosis Date  . Diabetes mellitus without complication (HCC)   . GERD (gastroesophageal reflux disease)   . Hyperlipidemia   . Hypertension     Past Surgical History:  Procedure Laterality Date  . HERNIA REPAIR      Family History  Problem Relation Age of Onset  . Heart disease Father   . Diabetes Brother   . Hypertension Brother    Social History:  reports that he has been smoking cigarettes. He has been smoking about 1.00 pack per day. He has never used smokeless tobacco. He reports previous alcohol use. He reports that he does not use drugs.  Allergies:  Allergies  Allergen Reactions  . Sulfa Antibiotics     No medications prior to admission.    No results found for this or any previous visit (from the past 48 hour(s)). No results found.  Review of Systems  Constitutional: Negative.   HENT: Negative.   Eyes: Negative.   Respiratory: Negative.   Cardiovascular: Negative.   Gastrointestinal: Positive for abdominal pain.  Endocrine: Negative.   Genitourinary: Negative.   Musculoskeletal: Negative.   Allergic/Immunologic: Negative.   Neurological: Negative.   Hematological: Negative.   Psychiatric/Behavioral: Negative.     There were no vitals taken for this visit. Physical Exam Vitals reviewed.  Constitutional:      Appearance: Normal appearance. He is not ill-appearing.  HENT:     Head: Normocephalic and atraumatic.  Cardiovascular:     Rate and Rhythm: Normal rate and regular rhythm.     Heart  sounds: Normal heart sounds. No murmur heard.  No friction rub. No gallop.   Pulmonary:     Effort: Pulmonary effort is normal. No respiratory distress.     Breath sounds: Normal breath sounds. No stridor. No wheezing, rhonchi or rales.  Abdominal:     General: Abdomen is flat. Bowel sounds are normal. There is no distension.     Palpations: Abdomen is soft. There is no mass.     Tenderness: There is abdominal tenderness. There is no guarding or rebound.     Hernia: No hernia is present.     Comments: Open 5 to 7 mm wound in the left upper portion of his abdominal wall.  Some induration and erythema are present.  There is some tracking subcutaneously towards the midline surgical scar.  Serosanguineous drainage is present.  Skin:    General: Skin is warm and dry.  Neurological:     Mental Status: He is alert and oriented to person, place, and time.      Assessment/Plan Impression: Abdominal wall cellulitis, probable suture granuloma.  Less likely mesh infection. Plan: Patient will be taken to the operating room on 10/01/2020 for wound exploration and probable removal of suture granuloma.  The risks and benefits of the procedure including bleeding, infection, and the possibility of recurrence of the infection or the need to remove some mesh were fully explained to the patient, who gave informed consent.  [name], MD 09/18/2020, 11:29 AM

## 2020-09-25 NOTE — Patient Instructions (Addendum)
Your procedure is scheduled on: 10/01/2020  Report to [facility] at   6:15  AM.  Call this number if you have problems the morning of surgery: [phone]   Remember:   Do not Eat or Drink after midnight         No Smoking the morning of surgery  :  Take these medicines the morning of surgery with A SIP OF WATER: Pantoprazole               No diabetic medication am of procedure   Do not wear jewelry, make-up or nail polish.  Do not wear lotions, powders, or perfumes. You may wear deodorant.  Do not shave 48 hours prior to surgery. Men may shave face and neck.  Do not bring valuables to the hospital.  Contacts, dentures or bridgework may not be worn into surgery.  Leave suitcase in the car. After surgery it may be brought to your room.  For patients admitted to the hospital, checkout time is 11:00 AM the day of discharge.   Patients discharged the day of surgery will not be allowed to drive home.    Special Instructions: Shower using CHG night before surgery and shower the day of surgery use CHG.  Use special wash - you have one bottle of CHG for all showers.  You should use approximately 1/2 of the bottle for each shower.  How to Use Chlorhexidine for Bathing Chlorhexidine gluconate (CHG) is a germ-killing (antiseptic) solution that is used to clean the skin. It can get rid of the bacteria that normally live on the skin and can keep them away for about 24 hours. To clean your skin with CHG, you may be given:  A CHG solution to use in the shower or as part of a sponge bath.  A prepackaged cloth that contains CHG. Cleaning your skin with CHG may help lower the risk for infection:  While you are staying in the intensive care unit of the hospital.  If you have a vascular access, such as a central line, to provide short-term or long-term access to your veins.  If you have a catheter to drain urine from your bladder.  If you are on a ventilator. A ventilator is a machine that  helps you breathe by moving air in and out of your lungs.  After surgery. What are the risks? Risks of using CHG include:  A skin reaction.  Hearing loss, if CHG gets in your ears.  Eye injury, if CHG gets in your eyes and is not rinsed out.  The CHG product catching fire. Make sure that you avoid smoking and flames after applying CHG to your skin. Do not use CHG:  If you have a chlorhexidine allergy or have previously reacted to chlorhexidine.  On babies younger than 87 months of age. How to use CHG solution  Use CHG only as told by your health care provider, and follow the instructions on the label.  Use the full amount of CHG as directed. Usually, this is one bottle. During a shower Follow these steps when using CHG solution during a shower (unless your health care provider gives you different instructions): 1. Start the shower. 2. Use your normal soap and shampoo to wash your face and hair. 3. Turn off the shower or move out of the shower stream. 4. Pour the CHG onto a clean washcloth. Do not use any type of brush or rough-edged sponge. 5. Starting at your neck, lather your body  down to your toes. Make sure you follow these instructions: ? If you will be having surgery, pay special attention to the part of your body where you will be having surgery. Scrub this area for at least 1 minute. ? Do not use CHG on your head or face. If the solution gets into your ears or eyes, rinse them well with water. ? Avoid your genital area. ? Avoid any areas of skin that have broken skin, cuts, or scrapes. ? Scrub your back and under your arms. Make sure to wash skin folds. 6. Let the lather sit on your skin for 1-2 minutes or as long as told by your health care provider. 7. Thoroughly rinse your entire body in the shower. Make sure that all body creases and crevices are rinsed well. 8. Dry off with a clean towel. Do not put any substances on your body afterward--such as powder, lotion, or  perfume--unless you are told to do so by your health care provider. Only use lotions that are recommended by the manufacturer. 9. Put on clean clothes or pajamas. 10. If it is the night before your surgery, sleep in clean sheets.  During a sponge bath Follow these steps when using CHG solution during a sponge bath (unless your health care provider gives you different instructions): 1. Use your normal soap and shampoo to wash your face and hair. 2. Pour the CHG onto a clean washcloth. 3. Starting at your neck, lather your body down to your toes. Make sure you follow these instructions: ? If you will be having surgery, pay special attention to the part of your body where you will be having surgery. Scrub this area for at least 1 minute. ? Do not use CHG on your head or face. If the solution gets into your ears or eyes, rinse them well with water. ? Avoid your genital area. ? Avoid any areas of skin that have broken skin, cuts, or scrapes. ? Scrub your back and under your arms. Make sure to wash skin folds. 4. Let the lather sit on your skin for 1-2 minutes or as long as told by your health care provider. 5. Using a different clean, wet washcloth, thoroughly rinse your entire body. Make sure that all body creases and crevices are rinsed well. 6. Dry off with a clean towel. Do not put any substances on your body afterward--such as powder, lotion, or perfume--unless you are told to do so by your health care provider. Only use lotions that are recommended by the manufacturer. 7. Put on clean clothes or pajamas. 8. If it is the night before your surgery, sleep in clean sheets. How to use CHG prepackaged cloths  Only use CHG cloths as told by your health care provider, and follow the instructions on the label.  Use the CHG cloth on clean, dry skin.  Do not use the CHG cloth on your head or face unless your health care provider tells you to.  When washing with the CHG cloth: ? Avoid your genital  area. ? Avoid any areas of skin that have broken skin, cuts, or scrapes. Before surgery Follow these steps when using a CHG cloth to clean before surgery (unless your health care provider gives you different instructions): 1. Using the CHG cloth, vigorously scrub the part of your body where you will be having surgery. Scrub using a back-and-forth motion for 3 minutes. The area on your body should be completely wet with CHG when you are done scrubbing. 2.  Do not rinse. Discard the cloth and let the area air-dry. Do not put any substances on the area afterward, such as powder, lotion, or perfume. 3. Put on clean clothes or pajamas. 4. If it is the night before your surgery, sleep in clean sheets.  For general bathing Follow these steps when using CHG cloths for general bathing (unless your health care provider gives you different instructions). 1. Use a separate CHG cloth for each area of your body. Make sure you wash between any folds of skin and between your fingers and toes. Wash your body in the following order, switching to a new cloth after each step: ? The front of your neck, shoulders, and chest. ? Both of your arms, under your arms, and your hands. ? Your stomach and groin area, avoiding the genitals. ? Your right leg and foot. ? Your left leg and foot. ? The back of your neck, your back, and your buttocks. 2. Do not rinse. Discard the cloth and let the area air-dry. Do not put any substances on your body afterward--such as powder, lotion, or perfume--unless you are told to do so by your health care provider. Only use lotions that are recommended by the manufacturer. 3. Put on clean clothes or pajamas. Contact a health care provider if:  Your skin gets irritated after scrubbing.  You have questions about using your solution or cloth. Get help right away if:  Your eyes become very red or swollen.  Your eyes itch badly.  Your skin itches badly and is red or swollen.  Your  hearing changes.  You have trouble seeing.  You have swelling or tingling in your mouth or throat.  You have trouble breathing.  You swallow any chlorhexidine. Summary  Chlorhexidine gluconate (CHG) is a germ-killing (antiseptic) solution that is used to clean the skin. Cleaning your skin with CHG may help to lower your risk for infection.  You may be given CHG to use for bathing. It may be in a bottle or in a prepackaged cloth to use on your skin. Carefully follow your health care provider's instructions and the instructions on the product label.  Do not use CHG if you have a chlorhexidine allergy.  Contact your health care provider if your skin gets irritated after scrubbing. This information is not intended to replace advice given to you by your health care provider. Make sure you discuss any questions you have with your health care provider. Document Revised: 03/01/2019 Document Reviewed: 11/10/2017 Elsevier Patient Education  2020 [employer]  Sutured Wound Care Sutures are stitches that can be used to close wounds. Taking care of your wound properly can help prevent pain and infection. It can also help your wound to heal more quickly. Follow instructions from your doctor about how to care for your sutured wound. Supplies needed:  Soap and water.  A clean bandage (dressing), if needed.  Antibiotic ointment.  A clean towel. How to care for your sutured wound   Keep the wound completely dry for the first 24 hours or as long as told by your doctor. After 24-48 hours, you may shower or bathe as told by your doctor. Do not soak the wound or put the wound completely under water until the sutures have been removed.  After the first 24 hours, clean the wound once a day, or as often as your doctor tells you to. Take these steps: ? Wash the wound with soap and water. ? Rinse the wound with water. Make  sure to wash all the soap off. ? Pat the wound dry with a clean towel. Do not  rub the wound.  After cleaning the wound, put a thin layer of antibiotic ointment on the wound as told by your doctor. This will help: ? Prevent infection. ? Keep the bandage from sticking to the wound.  Follow instructions from your doctor about how to change your bandage: ? Wash your hands with soap and water. If you cannot use soap and water, use hand sanitizer. ? Change your bandage at least once a day, or as often as told by your doctor. If your dressing gets wet or dirty, change it. ? Leave sutures, skin glue, or skin tape (adhesive) strips in place. They may need to stay in place for 2 weeks or longer. If tape strips get loose and curl up, you may trim the loose edges. Do not remove tape strips completely unless your doctor says it is okay.  Check your wound every day for signs of infection. Watch for: ? Redness, swelling, or pain. ? Fluid or blood. ? Warmth. ? Pus or a bad smell.  Have the sutures removed as told by your doctor. Follow these instructions at home: Medicines  Take or apply over-the-counter and prescription medicines only as told by your doctor.  If you were prescribed an antibiotic medicine or ointment, take or apply it as told by your doctor. Do not stop using the antibiotic even if you start to feel better. General instructions  Cover your wound with clothes or put sunscreen on when you are outside. Use a sunscreen of at least 30 SPF.  Do not scratch or pick at your wound.  Avoid stretching your wound.  Raise (elevate) the injured area above the level of your heart while you are sitting or lying down, if possible.  Drink enough fluids to keep your pee (urine) clear or pale yellow.  Keep all follow-up visits as told by your doctor. This is important. Contact a doctor if:  You were given a tetanus shot and you have any of the following at the site where the needle went in: ? Swelling. ? Very bad pain. ? Redness. ? Bleeding.  Your wound breaks  open.  You have redness, swelling, or pain around your wound.  You have fluid or blood coming from your wound.  Your wound feels warm to the touch.  You have a fever.  You notice something coming out of your wound, such as wood or glass.  You have pain that does not get better with medicine.  The skin near your wound changes color.  You need to change your bandage often due to a lot of fluid, blood, or pus coming from the wound.  You get a new rash.  You get numbness around the wound. Get help right away if:  You have very bad swelling around your wound.  You have pus or a bad smell coming from your wound.  Your pain suddenly gets worse and is very bad.  You have painful lumps near your wound or anywhere on your body.  You have a red streak going away from your wound.  The wound is on your hand or foot, and: ? You cannot move a finger or toe as you used to do. ? Your fingers or toes look pale or blue. ? You have numbness that spreads down your hand, foot, fingers, or toes. Summary  Sutures are stitches that are used to close wounds.  Taking  care of your wound properly can help prevent pain and infection.  Keep the wound completely dry for the first 24 hours or for as long as told by your doctor. After 24-48 hours, you may shower or bathe as directed by your doctor. This information is not intended to replace advice given to you by your health care provider. Make sure you discuss any questions you have with your health care provider. Document Revised: 11/25/2017 Document Reviewed: 01/18/2017 Elsevier Patient Education  2020 [employer].

## 2020-09-26 ENCOUNTER — Other Ambulatory Visit (HOSPITAL_COMMUNITY): Payer: No Typology Code available for payment source

## 2020-09-29 ENCOUNTER — Other Ambulatory Visit (HOSPITAL_COMMUNITY)
Admission: RE | Admit: 2020-09-29 | Discharge: 2020-09-29 | Disposition: A | Discharge status: Home / Self Care | Payer: No Typology Code available for payment source | Source: Ambulatory Visit | Attending: General Surgery | Admitting: General Surgery

## 2020-09-29 ENCOUNTER — Other Ambulatory Visit: Payer: Self-pay

## 2020-09-29 ENCOUNTER — Encounter (HOSPITAL_COMMUNITY): Payer: Self-pay

## 2020-09-29 ENCOUNTER — Encounter (HOSPITAL_COMMUNITY)
Admission: RE | Admit: 2020-09-29 | Discharge: 2020-09-29 | Disposition: A | Discharge status: Home / Self Care | Payer: No Typology Code available for payment source | Source: Ambulatory Visit | Attending: General Surgery | Admitting: General Surgery

## 2020-09-29 DIAGNOSIS — Z20822 Contact with and (suspected) exposure to covid-19: Secondary | ICD-10-CM | POA: Diagnosis not present

## 2020-09-29 DIAGNOSIS — Z01812 Encounter for preprocedural laboratory examination: Secondary | ICD-10-CM | POA: Insufficient documentation

## 2020-09-29 DIAGNOSIS — I10 Essential (primary) hypertension: Secondary | ICD-10-CM | POA: Insufficient documentation

## 2020-09-29 DIAGNOSIS — Z01818 Encounter for other preprocedural examination: Secondary | ICD-10-CM | POA: Diagnosis present

## 2020-09-29 LAB — SARS CORONAVIRUS 2 (TAT 6-24 HRS): SARS Coronavirus 2: NEGATIVE

## 2020-10-01 ENCOUNTER — Ambulatory Visit (HOSPITAL_COMMUNITY): Payer: No Typology Code available for payment source | Admitting: Certified Registered"

## 2020-10-01 ENCOUNTER — Encounter (HOSPITAL_COMMUNITY)
Admission: RE | Disposition: A | Discharge status: Home / Self Care | Payer: Self-pay | Source: Home / Self Care | Attending: General Surgery

## 2020-10-01 ENCOUNTER — Other Ambulatory Visit (HOSPITAL_COMMUNITY): Payer: Self-pay | Admitting: General Surgery

## 2020-10-01 ENCOUNTER — Encounter (HOSPITAL_COMMUNITY): Payer: Self-pay | Admitting: General Surgery

## 2020-10-01 ENCOUNTER — Ambulatory Visit (HOSPITAL_COMMUNITY)
Admission: RE | Admit: 2020-10-01 | Discharge: 2020-10-01 | Disposition: A | Discharge status: Home / Self Care | Payer: No Typology Code available for payment source | Attending: General Surgery | Admitting: General Surgery

## 2020-10-01 DIAGNOSIS — E785 Hyperlipidemia, unspecified: Secondary | ICD-10-CM | POA: Insufficient documentation

## 2020-10-01 DIAGNOSIS — F17210 Nicotine dependence, cigarettes, uncomplicated: Secondary | ICD-10-CM | POA: Insufficient documentation

## 2020-10-01 DIAGNOSIS — T8189XS Other complications of procedures, not elsewhere classified, sequela: Secondary | ICD-10-CM | POA: Diagnosis not present

## 2020-10-01 DIAGNOSIS — Z882 Allergy status to sulfonamides status: Secondary | ICD-10-CM | POA: Diagnosis not present

## 2020-10-01 DIAGNOSIS — I10 Essential (primary) hypertension: Secondary | ICD-10-CM | POA: Diagnosis not present

## 2020-10-01 DIAGNOSIS — Z833 Family history of diabetes mellitus: Secondary | ICD-10-CM | POA: Diagnosis not present

## 2020-10-01 DIAGNOSIS — K219 Gastro-esophageal reflux disease without esophagitis: Secondary | ICD-10-CM | POA: Diagnosis not present

## 2020-10-01 DIAGNOSIS — L03311 Cellulitis of abdominal wall: Secondary | ICD-10-CM | POA: Insufficient documentation

## 2020-10-01 DIAGNOSIS — Z8249 Family history of ischemic heart disease and other diseases of the circulatory system: Secondary | ICD-10-CM | POA: Insufficient documentation

## 2020-10-01 DIAGNOSIS — E119 Type 2 diabetes mellitus without complications: Secondary | ICD-10-CM | POA: Insufficient documentation

## 2020-10-01 DIAGNOSIS — A4902 Methicillin resistant Staphylococcus aureus infection, unspecified site: Secondary | ICD-10-CM | POA: Insufficient documentation

## 2020-10-01 DIAGNOSIS — T8189XA Other complications of procedures, not elsewhere classified, initial encounter: Secondary | ICD-10-CM

## 2020-10-01 HISTORY — PX: WOUND EXPLORATION: SHX6188

## 2020-10-01 LAB — GLUCOSE, CAPILLARY
Glucose-Capillary: 134 mg/dL — ABNORMAL HIGH (ref 70–99)
Glucose-Capillary: 132 mg/dL — ABNORMAL HIGH (ref 70–99)

## 2020-10-01 SURGERY — WOUND EXPLORATION
Anesthesia: General

## 2020-10-01 MED ORDER — EPHEDRINE 5 MG/ML INJ
INTRAVENOUS | Status: AC
  Filled 2020-10-01: qty 10

## 2020-10-01 MED ORDER — ONDANSETRON HCL 4 MG/2ML IJ SOLN
INTRAMUSCULAR | Status: AC
  Filled 2020-10-01: qty 2

## 2020-10-01 MED ORDER — DEXAMETHASONE SODIUM PHOSPHATE 10 MG/ML IJ SOLN
INTRAMUSCULAR | Status: AC
  Filled 2020-10-01: qty 1

## 2020-10-01 MED ORDER — ONDANSETRON HCL 4 MG/2ML IJ SOLN: 4.0000 mg | Freq: Once | INTRAMUSCULAR | Status: DC | PRN

## 2020-10-01 MED ORDER — PROPOFOL 10 MG/ML IV BOLUS
INTRAVENOUS | Status: DC | PRN
  Administered 2020-10-01: 250 mg via INTRAVENOUS

## 2020-10-01 MED ORDER — FENTANYL CITRATE (PF) 100 MCG/2ML IJ SOLN
INTRAMUSCULAR | Status: DC | PRN
Start: 2020-10-01 — End: 2020-10-01
  Administered 2020-10-01: 50 ug via INTRAVENOUS
  Administered 2020-10-01 (×2): 25 ug via INTRAVENOUS

## 2020-10-01 MED ORDER — LACTATED RINGERS IV SOLN
INTRAVENOUS | Status: DC
  Administered 2020-10-01: 1000 mL via INTRAVENOUS

## 2020-10-01 MED ORDER — POVIDONE-IODINE 10 % EX OINT
TOPICAL_OINTMENT | CUTANEOUS | Status: AC
  Filled 2020-10-01: qty 1

## 2020-10-01 MED ORDER — CHLORHEXIDINE GLUCONATE CLOTH 2 % EX PADS: 6.0000 | MEDICATED_PAD | Freq: Once | CUTANEOUS | Status: DC

## 2020-10-01 MED ORDER — CEFAZOLIN SODIUM-DEXTROSE 2-4 GM/100ML-% IV SOLN
INTRAVENOUS | Status: AC
  Filled 2020-10-01: qty 100

## 2020-10-01 MED ORDER — CHLORHEXIDINE GLUCONATE 0.12 % MT SOLN
15.0000 mL | Freq: Once | OROMUCOSAL | Status: AC
  Administered 2020-10-01: 15 mL via OROMUCOSAL

## 2020-10-01 MED ORDER — ONDANSETRON HCL 4 MG/2ML IJ SOLN
INTRAMUSCULAR | Status: DC | PRN
  Administered 2020-10-01: 4 mg via INTRAVENOUS

## 2020-10-01 MED ORDER — 0.9 % SODIUM CHLORIDE (POUR BTL) OPTIME
TOPICAL | Status: DC | PRN
  Administered 2020-10-01: 1000 mL

## 2020-10-01 MED ORDER — CEFAZOLIN SODIUM-DEXTROSE 2-4 GM/100ML-% IV SOLN
2.0000 g | INTRAVENOUS | Status: AC
  Administered 2020-10-01: 2 g via INTRAVENOUS

## 2020-10-01 MED ORDER — EPHEDRINE SULFATE 50 MG/ML IJ SOLN
INTRAMUSCULAR | Status: DC | PRN
  Administered 2020-10-01: 10 mg via INTRAVENOUS
  Administered 2020-10-01: 5 mg via INTRAVENOUS

## 2020-10-01 MED ORDER — BUPIVACAINE HCL (PF) 0.5 % IJ SOLN
INTRAMUSCULAR | Status: DC | PRN
  Administered 2020-10-01: 10 mL

## 2020-10-01 MED ORDER — BUPIVACAINE HCL (PF) 0.5 % IJ SOLN
INTRAMUSCULAR | Status: AC
  Filled 2020-10-01: qty 30

## 2020-10-01 MED ORDER — KETOROLAC TROMETHAMINE 30 MG/ML IJ SOLN
30.0000 mg | Freq: Once | INTRAMUSCULAR | Status: AC
  Administered 2020-10-01: 30 mg via INTRAVENOUS
  Filled 2020-10-01: qty 1

## 2020-10-01 MED ORDER — ORAL CARE MOUTH RINSE: 15.0000 mL | Freq: Once | OROMUCOSAL | Status: AC

## 2020-10-01 MED ORDER — HYDROCODONE-ACETAMINOPHEN 5-325 MG PO TABS
1.0000 | ORAL_TABLET | ORAL | 0 refills | Status: DC | PRN
Start: 2020-10-01 — End: 2020-10-14

## 2020-10-01 MED ORDER — MIDAZOLAM HCL 5 MG/5ML IJ SOLN
INTRAMUSCULAR | Status: DC | PRN
  Administered 2020-10-01: 2 mg via INTRAVENOUS

## 2020-10-01 MED ORDER — PROPOFOL 10 MG/ML IV BOLUS
INTRAVENOUS | Status: AC
  Filled 2020-10-01: qty 40

## 2020-10-01 MED ORDER — FENTANYL CITRATE (PF) 100 MCG/2ML IJ SOLN
INTRAMUSCULAR | Status: AC
  Filled 2020-10-01: qty 2

## 2020-10-01 MED ORDER — FENTANYL CITRATE (PF) 100 MCG/2ML IJ SOLN: 25.0000 ug | INTRAMUSCULAR | Status: DC | PRN

## 2020-10-01 MED ORDER — LIDOCAINE 2% (20 MG/ML) 5 ML SYRINGE
INTRAMUSCULAR | Status: DC | PRN
  Administered 2020-10-01: 100 mg via INTRAVENOUS

## 2020-10-01 MED ORDER — LIDOCAINE 2% (20 MG/ML) 5 ML SYRINGE
INTRAMUSCULAR | Status: AC
  Filled 2020-10-01: qty 5

## 2020-10-01 MED ORDER — MIDAZOLAM HCL 2 MG/2ML IJ SOLN
INTRAMUSCULAR | Status: AC
  Filled 2020-10-01: qty 2

## 2020-10-01 MED ORDER — DEXAMETHASONE SODIUM PHOSPHATE 4 MG/ML IJ SOLN
INTRAMUSCULAR | Status: DC | PRN
  Administered 2020-10-01: 10 mg via INTRAVENOUS

## 2020-10-01 MED ORDER — POVIDONE-IODINE 10 % OINT PACKET
TOPICAL_OINTMENT | CUTANEOUS | Status: DC | PRN
  Administered 2020-10-01: 1 via TOPICAL

## 2020-10-01 MED FILL — HYDROCODON-APAP 5-325: 5-325 | 5 days supply | Qty: 30 | Fill #0

## 2020-10-01 SURGICAL SUPPLY — 25 items
CLOTH BEACON ORANGE TIMEOUT ST (SAFETY) ×3 IMPLANT
COVER LIGHT HANDLE STERIS (MISCELLANEOUS) ×6 IMPLANT
COVER WAND RF STERILE (DRAPES) ×3 IMPLANT
DECANTER SPIKE VIAL GLASS SM (MISCELLANEOUS) ×3 IMPLANT
ELECT REM PT RETURN 9FT ADLT (ELECTROSURGICAL) ×3
ELECTRODE REM PT RTRN 9FT ADLT (ELECTROSURGICAL) ×2 IMPLANT
GAUZE SPONGE 4X4 12PLY STRL (GAUZE/BANDAGES/DRESSINGS) ×3 IMPLANT
GLOVE BIOGEL PI IND STRL 7.0 (GLOVE) ×6 IMPLANT
GLOVE BIOGEL PI INDICATOR 7.0 (GLOVE) ×3
GLOVE ECLIPSE 6.5 STRL STRAW (GLOVE) ×3 IMPLANT
GLOVE ECLIPSE 7.0 STRL STRAW (GLOVE) ×3 IMPLANT
GLOVE SURG SS PI 7.5 STRL IVOR (GLOVE) ×3 IMPLANT
GOWN STRL REUS W/TWL LRG LVL3 (GOWN DISPOSABLE) ×6 IMPLANT
KIT TURNOVER KIT A (KITS) ×3 IMPLANT
MANIFOLD NEPTUNE II (INSTRUMENTS) ×3 IMPLANT
NEEDLE HYPO 25X1 1.5 SAFETY (NEEDLE) ×3 IMPLANT
NS IRRIG 1000ML POUR BTL (IV SOLUTION) ×3 IMPLANT
PACK MINOR (CUSTOM PROCEDURE TRAY) ×3 IMPLANT
PAD ARMBOARD 7.5X6 YLW CONV (MISCELLANEOUS) ×3 IMPLANT
SET BASIN LINEN APH (SET/KITS/TRAYS/PACK) ×3 IMPLANT
STAPLER VISISTAT (STAPLE) ×3 IMPLANT
SUT CHROMIC 3 0 PS 2 (SUTURE) ×3 IMPLANT
SUT CHROMIC 3 0 SH 27 (SUTURE) ×3 IMPLANT
SYR CONTROL 10ML LL (SYRINGE) ×3 IMPLANT
TAPE CLOTH SURG 4X10 WHT LF (GAUZE/BANDAGES/DRESSINGS) ×3 IMPLANT

## 2020-10-01 NOTE — Discharge Instructions (Signed)
Suture Removal, Care After This sheet gives you information about how to care for yourself after your procedure. Your health care provider may also give you more specific instructions. If you have problems or questions, contact your health care provider. What can I expect after the procedure? After your stitches (sutures) are removed, it is common to have:  Some discomfort and swelling in the area.  Slight redness in the area. Follow these instructions at home: If you have a bandage:  Wash your hands with soap and water before you change your bandage (dressing). If soap and water are not available, use hand sanitizer.  Change your dressing as told by your health care provider. If your dressing becomes wet or dirty, or develops a bad smell, change it as soon as possible.  If your dressing sticks to your skin, soak it in warm water to loosen it. Wound care   Check your wound every day for signs of infection. Check for: ? More redness, swelling, or pain. ? Fluid or blood. ? Warmth. ? Pus or a bad smell.  Wash your hands with soap and water before and after touching your wound.  Apply cream or ointment only as directed by your health care provider. If you are using cream or ointment, wash the area with soap and water 2 times a day to remove all the cream or ointment. Rinse off the soap and pat the area dry with a clean towel.  If you have skin glue or adhesive strips on your wound, leave these closures in place. They may need to stay in place for 2 weeks or longer. If adhesive strip edges start to loosen and curl up, you may trim the loose edges. Do not remove adhesive strips completely unless your health care provider tells you to do that.  Keep the wound area dry and clean. Do not take baths, swim, or use a hot tub until your health care provider approves.  Continue to protect the wound from injury.  Do not pick at your wound. Picking can cause an infection.  When your wound has  completely healed, wear sunscreen over it or cover it with clothing when you are outside. New scars get sunburned easily, which can make scarring worse. General instructions  Take over-the-counter and prescription medicines only as told by your health care provider.  Keep all follow-up visits as told by your health care provider. This is important. Contact a health care provider if:  You have redness, swelling, or pain around your wound.  You have fluid or blood coming from your wound.  Your wound feels warm to the touch.  You have pus or a bad smell coming from your wound.  Your wound opens up. Get help right away if:  You have a fever.  You have redness that is spreading from your wound. Summary  After your sutures are removed, it is common to have some discomfort and swelling in the area.  Wash your hands with soap and water before you change your bandage (dressing).  Keep the wound area dry and clean. Do not take baths, swim, or use a hot tub until your health care provider approves. This information is not intended to replace advice given to you by your health care provider. Make sure you discuss any questions you have with your health care provider. Document Revised: 11/25/2017 Document Reviewed: 01/18/2017 Elsevier Patient Education  2020 [employer] Inc.  

## 2020-10-01 NOTE — Anesthesia Procedure Notes (Signed)
Procedure Name: LMA Insertion Performed by: Kianah Harries L, CRNA Pre-anesthesia Checklist: Patient identified, Emergency Drugs available, Suction available, Patient being monitored and Timeout performed Patient Re-evaluated:Patient Re-evaluated prior to induction Oxygen Delivery Method: Circle system utilized Preoxygenation: Pre-oxygenation with 100% oxygen Induction Type: IV induction LMA: LMA inserted LMA Size: 4.0 Number of attempts: 1 Placement Confirmation: positive ETCO2,  CO2 detector and breath sounds checked- equal and bilateral Tube secured with: Tape Dental Injury: Teeth and Oropharynx as per pre-operative assessment        

## 2020-10-01 NOTE — Interval H&P Note (Signed)
History and Physical Interval Note:  10/01/2020 7:22 AM  Marvin Tapia  has presented today for surgery, with the diagnosis of Suture granuloma.  The various methods of treatment have been discussed with the patient and family. After consideration of risks, benefits and other options for treatment, the patient has consented to  Procedure(s): ABDOMINAL WOUND EXPLORATION as a surgical intervention.  The patient's history has been reviewed, patient examined, no change in status, stable for surgery.  I have reviewed the patient's chart and labs.  Questions were answered to the patient's satisfaction.     [name]

## 2020-10-01 NOTE — Anesthesia Postprocedure Evaluation (Signed)
Anesthesia Post Note  Patient: Marvin Tapia  Procedure(s) Performed: ABDOMINAL WOUND EXPLORATION  Patient location during evaluation: PACU Anesthesia Type: General Level of consciousness: awake, oriented, awake and alert and patient cooperative Pain management: pain level controlled Vital Signs Assessment: post-procedure vital signs reviewed and stable Respiratory status: spontaneous breathing, respiratory function stable and nonlabored ventilation Cardiovascular status: blood pressure returned to baseline and stable Postop Assessment: no headache and no backache Anesthetic complications: no   No complications documented.   Last Vitals:  Vitals:   10/01/20 0700 10/01/20 0715  BP: 117/76 (!) 125/98  Pulse: 60 60  Resp: (!) 24 (!) 22  Temp:    SpO2: 94% 93%    Last Pain:  Vitals:   10/01/20 0642  TempSrc: Oral  PainSc: 0-No pain                 Brynda Peon

## 2020-10-01 NOTE — Transfer of Care (Signed)
Immediate Anesthesia Transfer of Care Note  Patient: Marvin Tapia  Procedure(s) Performed: ABDOMINAL WOUND EXPLORATION  Patient Location: PACU  Anesthesia Type:General  Level of Consciousness: awake, alert , oriented and patient cooperative  Airway & Oxygen Therapy: Patient Spontanous Breathing  Post-op Assessment: Report given to RN, Post -op Vital signs reviewed and stable and Patient moving all extremities  Post vital signs: Reviewed and stable  Last Vitals:  Vitals Value Taken Time  BP    Temp    Pulse 103 10/01/20 0823  Resp 13 10/01/20 0823  SpO2 94 % 10/01/20 0823  Vitals shown include unvalidated device data.  Last Pain:  Vitals:   10/01/20 0642  TempSrc: Oral  PainSc: 0-No pain      Patients Stated Pain Goal: 8 (10/01/20 2585)  Complications: No complications documented.

## 2020-10-01 NOTE — Op Note (Signed)
Patient:  Marvin Tapia  DOB:  Apr 24, 1971  MRN:  [id number]   Preop Diagnosis: Chronic draining abdominal wound  Postop Diagnosis: Same, multiple suture granulomas  Procedure: Excision of multiple sutures, abdominal wall  Surgeon: [name], MD  Anes: General  Indications: Patient is a 49 year old white male status post ventral herniorrhaphy with mesh in the remote past who has had a chronic draining wound along the upper portion of the repair.  Despite multiple antibiotic courses throughout the years, the patient has recurrence of the wound.  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  The risks and benefits of the procedure including bleeding, infection, and recurrence of the wound were fully explained to the patient, who gave informed consent.  Procedure note: The patient was placed in the supine position.  After general anesthesia was administered, the upper abdomen was prepped and draped using usual sterile technique with Betadine.  Surgical site confirmation was performed.  A granulomatous wound was noted superior to the left of the umbilicus.  An elliptical incision was made around the wound.  This was taken down to the muscle wall.  A small amount of dusky fluid was seen and this appeared to be just over a blue suture.  In total, 3 sutures were removed.  The wound was irrigated with normal saline.  I was able to get down to what appeared to be incorporated mesh of the abdominal wall.  This was irrigated.  An aerobic culture was taken and sent to microbiology.  The small fascial defect over the mesh was closed using a 3-0 Chromic Gut interrupted suture.  The subcutaneous layer was reapproximated using 3-0 Chromic Gut interrupted sutures.  0.5% Sensorcaine was instilled into the surrounding wound.  The skin was closed using staples.  Betadine ointment and a dry sterile dressing were applied.  All tape and needle counts were correct at the end of the procedure.  The patient was  awakened and transferred to PACU in stable condition.  Complications: None  EBL: Minimal  Specimen: Aerobic cultures of abdominal wall

## 2020-10-01 NOTE — Anesthesia Preprocedure Evaluation (Signed)
Anesthesia Evaluation  Patient identified by MRN, date of birth, ID band Patient awake    Reviewed: Allergy & Precautions, H&P , NPO status , Patient's Chart, lab work & pertinent test results, reviewed documented beta blocker date and time   Airway Mallampati: II  TM Distance: >3 FB Neck ROM: full    Dental no notable dental hx.    Pulmonary neg pulmonary ROS, Current Smoker and Patient abstained from smoking.,    Pulmonary exam normal breath sounds clear to auscultation       Cardiovascular Exercise Tolerance: Good hypertension, negative cardio ROS   Rhythm:regular Rate:Normal     Neuro/Psych negative neurological ROS  negative psych ROS   GI/Hepatic Neg liver ROS, GERD  Medicated,  Endo/Other  negative endocrine ROSdiabetes  Renal/GU negative Renal ROS  negative genitourinary   Musculoskeletal   Abdominal   Peds  Hematology negative hematology ROS (+)   Anesthesia Other Findings   Reproductive/Obstetrics negative OB ROS                             Anesthesia Physical Anesthesia Plan  ASA: II  Anesthesia Plan: General   Post-op Pain Management:    Induction:   PONV Risk Score and Plan:   Airway Management Planned:   Additional Equipment:   Intra-op Plan:   Post-operative Plan:   Informed Consent: I have reviewed the patients History and Physical, chart, labs and discussed the procedure including the risks, benefits and alternatives for the proposed anesthesia with the patient or authorized representative who has indicated his/her understanding and acceptance.     Dental Advisory Given  Plan Discussed with: CRNA  Anesthesia Plan Comments:         Anesthesia Quick Evaluation

## 2020-10-02 ENCOUNTER — Encounter (HOSPITAL_COMMUNITY): Payer: Self-pay | Admitting: General Surgery

## 2020-10-03 LAB — AEROBIC CULTURE W GRAM STAIN (SUPERFICIAL SPECIMEN)

## 2020-10-09 ENCOUNTER — Other Ambulatory Visit: Payer: Self-pay | Admitting: Family Medicine

## 2020-10-09 DIAGNOSIS — L03311 Cellulitis of abdominal wall: Secondary | ICD-10-CM

## 2020-10-09 MED ORDER — DOXYCYCLINE HYCLATE 50 MG PO CAPS
100.0000 mg | ORAL_CAPSULE | Freq: Two times a day (BID) | ORAL | 0 refills | Status: DC
Start: 2020-10-09 — End: 2020-10-09

## 2020-10-09 MED FILL — DOXYCYCLINE HYC 50 MG CAP: 50 | 20 days supply | Qty: 40 | Fill #0

## 2020-10-14 ENCOUNTER — Encounter: Payer: Self-pay | Admitting: Internal Medicine

## 2020-10-14 ENCOUNTER — Encounter: Payer: Self-pay | Admitting: General Surgery

## 2020-10-14 ENCOUNTER — Other Ambulatory Visit: Payer: Self-pay

## 2020-10-14 ENCOUNTER — Ambulatory Visit: Payer: No Typology Code available for payment source | Admitting: General Surgery

## 2020-10-14 ENCOUNTER — Ambulatory Visit (INDEPENDENT_AMBULATORY_CARE_PROVIDER_SITE_OTHER): Payer: No Typology Code available for payment source | Admitting: General Surgery

## 2020-10-14 VITALS — BP 130/84 | HR 81 | Temp 97.5°F | Resp 16 | Ht 71.0 in | Wt 211.0 lb

## 2020-10-14 DIAGNOSIS — Z09 Encounter for follow-up examination after completed treatment for conditions other than malignant neoplasm: Secondary | ICD-10-CM

## 2020-10-14 NOTE — Progress Notes (Signed)
Subjective:     Marvin Tapia  Here for follow-up wound check.  Patient states the wound is doing much better.  Minimal yellow drainage noted.  No purulent drainage noted.  He denies any fevers. Objective:    BP 130/84   Pulse 81   Temp (!) 97.5 F (36.4 C) (Oral)   Resp 16   Ht 5\' 11"  (1.803 m)   Wt 211 lb (95.7 kg)   SpO2 97%   BMI 29.43 kg/m   General:  alert, cooperative and no distress  Wound has closed over with minimal granulation tissue noted superficially.  Silver nitrate was applied.  Induration and erythema resolving.     Assessment:    Doing well postoperatively. Wound almost closed.    Plan:   Keep wound clean and dry with soap and water.  Follow-up here as needed.  Finish antibiotic course.

## 2020-10-15 ENCOUNTER — Other Ambulatory Visit: Payer: Self-pay | Admitting: Internal Medicine

## 2020-10-15 MED ORDER — LISINOPRIL 20 MG PO TABS
20.0000 mg | ORAL_TABLET | Freq: Every day | ORAL | 1 refills | Status: DC
Start: 2020-10-15 — End: 2020-10-15

## 2020-10-15 MED FILL — LISINOPRIL 20 MG TABS: 20 | 90 days supply | Qty: 90 | Fill #0

## 2020-11-21 ENCOUNTER — Other Ambulatory Visit: Payer: Self-pay | Admitting: Internal Medicine

## 2020-11-23 NOTE — Telephone Encounter (Signed)
I do not see where you have been filling the Metformin... please advise

## 2020-11-24 ENCOUNTER — Other Ambulatory Visit: Payer: Self-pay | Admitting: Internal Medicine

## 2020-11-24 MED FILL — METFORMIN HCL 1000 MG TABS: 1000 | 90 days supply | Qty: 180 | Fill #0

## 2020-12-04 ENCOUNTER — Other Ambulatory Visit: Payer: Self-pay | Admitting: Internal Medicine

## 2020-12-05 ENCOUNTER — Other Ambulatory Visit: Payer: Self-pay | Admitting: Internal Medicine

## 2020-12-05 MED FILL — LOVASTATIN 40 MG TABS: 40 | 90 days supply | Qty: 90 | Fill #0

## 2020-12-10 MED ORDER — FREESTYLE LITE TEST VI STRP: 1.0000 | ORAL_STRIP | Freq: Two times a day (BID) | 1 refills | Status: DC

## 2020-12-10 MED ORDER — FREESTYLE LITE W/DEVICE KIT: 1.0000 | PACK | Freq: Once | 0 refills | Status: AC

## 2020-12-10 MED FILL — FREESTYLE LITE TEST STRIP: 90 days supply | Qty: 200 | Fill #0

## 2020-12-10 MED FILL — FREESTYLE LITE METER: W/DEVICE | 30 days supply | Qty: 1 | Fill #0

## 2021-01-19 MED FILL — PANTOPRAZOLE SOD DR 40 MG T: 40 | 90 days supply | Qty: 90 | Fill #1

## 2021-01-27 MED FILL — LISINOPRIL 20 MG TABS: 20 | 90 days supply | Qty: 90 | Fill #1

## 2021-03-17 ENCOUNTER — Other Ambulatory Visit (HOSPITAL_BASED_OUTPATIENT_CLINIC_OR_DEPARTMENT_OTHER): Payer: Self-pay

## 2021-03-30 ENCOUNTER — Other Ambulatory Visit: Payer: Self-pay | Admitting: Internal Medicine

## 2021-04-01 ENCOUNTER — Other Ambulatory Visit (HOSPITAL_COMMUNITY): Payer: Self-pay

## 2021-04-01 MED ORDER — LOVASTATIN 40 MG PO TABS
ORAL_TABLET | Freq: Every day | ORAL | 0 refills | Status: DC
  Filled 2021-04-01: qty 90, 90d supply, fill #0

## 2021-04-02 ENCOUNTER — Other Ambulatory Visit (HOSPITAL_COMMUNITY): Payer: Self-pay

## 2021-05-15 ENCOUNTER — Other Ambulatory Visit: Payer: Self-pay | Admitting: Internal Medicine

## 2021-05-18 ENCOUNTER — Other Ambulatory Visit: Payer: Self-pay | Admitting: Internal Medicine

## 2021-05-18 ENCOUNTER — Other Ambulatory Visit (HOSPITAL_COMMUNITY): Payer: Self-pay

## 2021-05-19 ENCOUNTER — Other Ambulatory Visit (HOSPITAL_COMMUNITY): Payer: Self-pay

## 2021-05-20 ENCOUNTER — Telehealth: Payer: Self-pay | Admitting: Internal Medicine

## 2021-05-20 ENCOUNTER — Other Ambulatory Visit: Payer: Self-pay | Admitting: Internal Medicine

## 2021-05-20 ENCOUNTER — Other Ambulatory Visit (HOSPITAL_COMMUNITY): Payer: Self-pay

## 2021-05-20 NOTE — Telephone Encounter (Signed)
Medication Refill - Medication:   lisinopril (ZESTRIL) 20 MG tablet  pantoprazole (PROTONIX) 40 MG tablet  Has the patient contacted their pharmacy? Yes.  contact PCP.   Preferred Pharmacy (with phone number or street name):   [facility]  [address] [place] [address]  Phone: [phone] Fax: [phone]         Agent: Please be advised that RX refills may take up to 3 business days. We ask that you follow-up with your pharmacy.

## 2021-05-20 NOTE — Telephone Encounter (Signed)
Requested medications are due for refill today.  yes  Requested medications are on the active medications list.  yes  Last refill. 10/15/2020 & 08/20/2020  Future visit scheduled.   no  Notes to clinic.  Please advise.

## 2021-05-22 ENCOUNTER — Other Ambulatory Visit (HOSPITAL_COMMUNITY): Payer: Self-pay

## 2021-05-22 MED FILL — Pantoprazole Sodium EC Tab 40 MG (Base Equiv): ORAL | 90 days supply | Qty: 90 | Fill #0 | Status: AC

## 2021-05-22 MED FILL — Lisinopril Tab 20 MG: ORAL | 90 days supply | Qty: 90 | Fill #0 | Status: AC

## 2021-07-03 ENCOUNTER — Other Ambulatory Visit (HOSPITAL_COMMUNITY): Payer: Self-pay

## 2021-07-07 ENCOUNTER — Other Ambulatory Visit (HOSPITAL_COMMUNITY): Payer: Self-pay

## 2021-07-20 ENCOUNTER — Other Ambulatory Visit (HOSPITAL_COMMUNITY): Payer: Self-pay

## 2021-07-20 MED FILL — Metformin HCl Tab 1000 MG: ORAL | 90 days supply | Qty: 180 | Fill #0 | Status: AC

## 2021-07-21 ENCOUNTER — Other Ambulatory Visit (HOSPITAL_COMMUNITY): Payer: Self-pay

## 2021-08-07 ENCOUNTER — Telehealth: Payer: No Typology Code available for payment source | Admitting: Nurse Practitioner

## 2021-08-07 ENCOUNTER — Encounter: Payer: Self-pay | Admitting: Nurse Practitioner

## 2021-08-07 DIAGNOSIS — L089 Local infection of the skin and subcutaneous tissue, unspecified: Secondary | ICD-10-CM | POA: Diagnosis not present

## 2021-08-07 DIAGNOSIS — E11628 Type 2 diabetes mellitus with other skin complications: Secondary | ICD-10-CM | POA: Diagnosis not present

## 2021-08-07 MED ORDER — CEPHALEXIN 500 MG PO CAPS
500.0000 mg | ORAL_CAPSULE | Freq: Four times a day (QID) | ORAL | 0 refills | Status: DC
Start: 2021-08-07 — End: 2021-08-14

## 2021-08-07 NOTE — Progress Notes (Signed)
Virtual Visit Consent   Marvin Tapia, you are scheduled for a virtual visit with a [facility] provider today.     Just as with appointments in the office, your consent must be obtained to participate.  Your consent will be active for this visit and any virtual visit you may have with one of our providers in the next 365 days.     If you have a MyChart account, a copy of this consent can be sent to you electronically.  All virtual visits are billed to your insurance company just like a traditional visit in the office.    As this is a virtual visit, video technology does not allow for your provider to perform a traditional examination.  This may limit your provider's ability to fully assess your condition.  If your provider identifies any concerns that need to be evaluated in person or the need to arrange testing (such as labs, EKG, etc.), we will make arrangements to do so.     Although advances in technology are sophisticated, we cannot ensure that it will always work on either your end or our end.  If the connection with a video visit is poor, the visit may have to be switched to a telephone visit.  With either a video or telephone visit, we are not always able to ensure that we have a secure connection.     I need to obtain your verbal consent now.   Are you willing to proceed with your visit today?    Marvin Tapia has provided verbal consent on 08/07/2021 for a virtual visit (video or telephone).   Viviano Simas, FNP   Date: 08/07/2021 12:32 PM   Virtual Visit via Video Note   I, Viviano Simas, connected with  Marvin Tapia  ([id number], October 31, 1971) on 08/07/21 at 12:30 PM EDT by a video-enabled telemedicine application and verified that I am speaking with the correct person using two identifiers.  Location: Patient: Virtual Visit Location Patient: Home Provider: Virtual Visit Location Provider: Office/Clinic   I discussed the limitations of evaluation and management by  telemedicine and the availability of in person appointments. The patient expressed understanding and agreed to proceed.    History of Present Illness: Marvin Tapia is a 50 y.o. who identifies as a male who was assigned male at birth, and is being seen today after he dropped plywood on his toe 2 weeks ago while in [place]. He went to the ED and had an XRAY done at that time and there was no fracture. The nail has started to come off and he believes he has an infection under the nail.   He has not taken an antibiotic.   His toe is throbbing. Denies loss of sensation or color to toe  He is diabetic  Does not currently have PCP or podiatrist   Most recent labs 2021 Problems:  Patient Active Problem List   Diagnosis Date Noted   Suture granuloma    Essential hypertension 05/15/2020   Pure hypercholesterolemia 05/15/2020   Type 2 diabetes mellitus without complication, without long-term current use of insulin (HCC) 05/15/2020   GERD (gastroesophageal reflux disease) 05/15/2020    Allergies:  Allergies  Allergen Reactions   Sulfa Antibiotics     Unknown   Medications:  Current Outpatient Medications:    doxycycline (VIBRAMYCIN) 50 MG capsule, TAKE 2 CAPSULES (100 MG TOTAL) BY MOUTH 2 (TWO) TIMES DAILY., Disp: 40 capsule, Rfl: 0   glucose blood (FREESTYLE LITE)  test strip, 1 each by Other route in the morning and at bedtime., Disp: 200 each, Rfl: 1   lisinopril (ZESTRIL) 20 MG tablet, TAKE 1 TABLET BY MOUTH ONCE DAILY, Disp: 90 tablet, Rfl: 1   lovastatin (MEVACOR) 40 MG tablet, TAKE 1 TABLET BY MOUTH AT BEDTIME, Disp: 90 tablet, Rfl: 0   metFORMIN (GLUCOPHAGE) 1000 MG tablet, TAKE 1 TABLET BY MOUTH TWICE DAILY, Disp: 180 tablet, Rfl: 3   pantoprazole (PROTONIX) 40 MG tablet, TAKE 1 TABLET BY MOUTH DAILY., Disp: 90 tablet, Rfl: 1  Observations/Objective: Patient is well-developed, well-nourished in no acute distress.  Resting comfortably at home.  Head is normocephalic, atraumatic.   No labored breathing.  Speech is clear and coherent with logical content.  Patient is alert and oriented at baseline.  Large toe with erythema and drainage, patient is able to feel toe at distal point.   Assessment and Plan: 1. Toe infection  - Ambulatory referral to Podiatry  - cephALEXin (KEFLEX) 500 MG capsule; Take 1 capsule (500 mg total) by mouth 4 (four) times daily for 7 days.  Dispense: 28 capsule; Refill: 0  2. Type 2 diabetes mellitus with other skin complication, without long-term current use of insulin (HCC)  - Ambulatory referral to Podiatry  Provided referral due to patient having FOCUS plan with [facility] that requires a referral, and he does not have PCP at the moment   Patient to report to UC or ED if unable to be seen for evaluation of nail removal by podiatry outpatient   Follow Up Instructions: I discussed the assessment and treatment plan with the patient. The patient was provided an opportunity to ask questions and all were answered. The patient agreed with the plan and demonstrated an understanding of the instructions.  A copy of instructions were sent to the patient via MyChart.  The patient was advised to call back or seek an in-person evaluation if the symptoms worsen or if the condition fails to improve as anticipated.  Time:  I spent 20 minutes with the patient via telehealth technology discussing the above problems/concerns.    Viviano Simas, FNP

## 2021-08-10 ENCOUNTER — Other Ambulatory Visit: Payer: Self-pay | Admitting: Internal Medicine

## 2021-08-10 ENCOUNTER — Other Ambulatory Visit: Payer: Self-pay

## 2021-08-10 ENCOUNTER — Ambulatory Visit: Payer: No Typology Code available for payment source

## 2021-08-10 ENCOUNTER — Encounter: Payer: Self-pay | Admitting: Podiatry

## 2021-08-10 ENCOUNTER — Ambulatory Visit: Payer: No Typology Code available for payment source | Admitting: Podiatry

## 2021-08-10 ENCOUNTER — Other Ambulatory Visit (HOSPITAL_COMMUNITY): Payer: Self-pay

## 2021-08-10 DIAGNOSIS — S90222A Contusion of left lesser toe(s) with damage to nail, initial encounter: Secondary | ICD-10-CM | POA: Diagnosis not present

## 2021-08-10 DIAGNOSIS — L03032 Cellulitis of left toe: Secondary | ICD-10-CM

## 2021-08-10 DIAGNOSIS — S9032XA Contusion of left foot, initial encounter: Secondary | ICD-10-CM

## 2021-08-10 NOTE — Patient Instructions (Signed)

## 2021-08-10 NOTE — Progress Notes (Signed)
Subjective:  Patient ID: Marvin Tapia, male    DOB: 18-May-1971,  MRN: [id number] HPI Chief Complaint  Patient presents with   Toe Injury    Hallux left - dropped a piece of plywood onto toenail x 2 weeks ago, went to [facility] - xrayed, "no fracture", loose toenail, redness, swelling and draining at the base of nail, on cephalexin   New Patient (Initial Visit)   Diabetes    Last a1c was 6.2    50 y.o. male presents with the above complaint.   ROS: Denies fever chills nausea vomiting muscle aches pains calf pain back pain chest pain shortness of breath.  Past Medical History:  Diagnosis Date   Diabetes mellitus without complication (HCC)    GERD (gastroesophageal reflux disease)    Hyperlipidemia    Hypertension    Past Surgical History:  Procedure Laterality Date   HERNIA REPAIR     TONSILLECTOMY     WOUND EXPLORATION  10/01/2020   Procedure: ABDOMINAL WOUND EXPLORATION;  Surgeon: [name], MD;  Location: [facility];  Service: General;;    Current Outpatient Medications:    cephALEXin (KEFLEX) 500 MG capsule, Take 1 capsule (500 mg total) by mouth 4 (four) times daily for 7 days., Disp: 28 capsule, Rfl: 0   doxycycline (VIBRAMYCIN) 50 MG capsule, TAKE 2 CAPSULES (100 MG TOTAL) BY MOUTH 2 (TWO) TIMES DAILY., Disp: 40 capsule, Rfl: 0   glucose blood (FREESTYLE LITE) test strip, 1 each by Other route in the morning and at bedtime., Disp: 200 each, Rfl: 1   HYDROcodone-acetaminophen (NORCO/VICODIN) 5-325 MG tablet, Take 1 tablet by mouth every 6 (six) hours as needed., Disp: , Rfl:    lisinopril (ZESTRIL) 20 MG tablet, TAKE 1 TABLET BY MOUTH ONCE DAILY, Disp: 90 tablet, Rfl: 1   lovastatin (MEVACOR) 40 MG tablet, TAKE 1 TABLET BY MOUTH AT BEDTIME, Disp: 90 tablet, Rfl: 0   metFORMIN (GLUCOPHAGE) 1000 MG tablet, TAKE 1 TABLET BY MOUTH TWICE DAILY, Disp: 180 tablet, Rfl: 3   pantoprazole (PROTONIX) 40 MG tablet, TAKE 1 TABLET BY MOUTH DAILY., Disp: 90 tablet, Rfl: 1  Allergies   Allergen Reactions   Sulfa Antibiotics     Unknown   Review of Systems Objective:  There were no vitals filed for this visit.  General: Well developed, nourished, in no acute distress, alert and oriented x3   Dermatological: Skin is warm, dry and supple bilateral. Nails x 10 are well maintained; remaining integument appears unremarkable at this time. There are no open sores, no preulcerative lesions, no rash or signs of infection present.  Vascular: Dorsalis Pedis artery and Posterior Tibial artery pedal pulses are 2/4 bilateral with immedate capillary fill time. Pedal hair growth present. No varicosities and no lower extremity edema present bilateral.  Hallux nail left does demonstrate an area of superficial purulence with mild erythema no cellulitis no odor.  The nail margin appears to be lifted from the toenail bed but does not freely foot.  Neruologic: Grossly intact via light touch bilateral. Vibratory intact via tuning fork bilateral. Protective threshold with Semmes Wienstein monofilament intact to all pedal sites bilateral. Patellar and Achilles deep tendon reflexes 2+ bilateral. No Babinski or clonus noted bilateral.   Musculoskeletal: No gross boney pedal deformities bilateral. No pain, crepitus, or limitation noted with foot and ankle range of motion bilateral. Muscular strength 5/5 in all groups tested bilateral.  Gait: Unassisted, Nonantalgic.    Radiographs:  None taken he declined.  He did not  bring his x-rays with him from urgent care.  Assessment & Plan:   Assessment: Paronychia type II diabetic hallux left secondary to trauma date of injury 07/25/2021  Plan: Continue current therapies we are going to increase the concentration of salt.  He will continue his cephalexin.  On a follow-up with him in 2 weeks to watch for signs symptoms of infection notify me if there are any.     Johnay Mano T. Nore[place], [place]

## 2021-08-12 ENCOUNTER — Other Ambulatory Visit (HOSPITAL_COMMUNITY): Payer: Self-pay

## 2021-08-12 MED FILL — Lovastatin Tab 40 MG: ORAL | 90 days supply | Qty: 90 | Fill #0 | Status: AC

## 2021-08-12 NOTE — Telephone Encounter (Signed)
Last Cpe Baity was on 07/03/2020 Has Toc with Matt on 08/14/2021 Last filled #[phone] on 04/01/2021

## 2021-08-14 ENCOUNTER — Ambulatory Visit (INDEPENDENT_AMBULATORY_CARE_PROVIDER_SITE_OTHER): Payer: No Typology Code available for payment source | Admitting: Nurse Practitioner

## 2021-08-14 ENCOUNTER — Other Ambulatory Visit (HOSPITAL_COMMUNITY): Payer: Self-pay

## 2021-08-14 ENCOUNTER — Encounter: Payer: Self-pay | Admitting: Nurse Practitioner

## 2021-08-14 ENCOUNTER — Other Ambulatory Visit: Payer: Self-pay

## 2021-08-14 VITALS — BP 104/62 | HR 83 | Temp 96.6°F | Resp 20 | Ht 70.25 in | Wt 218.5 lb

## 2021-08-14 DIAGNOSIS — Z125 Encounter for screening for malignant neoplasm of prostate: Secondary | ICD-10-CM | POA: Diagnosis not present

## 2021-08-14 DIAGNOSIS — E7800 Pure hypercholesterolemia, unspecified: Secondary | ICD-10-CM | POA: Diagnosis not present

## 2021-08-14 DIAGNOSIS — E119 Type 2 diabetes mellitus without complications: Secondary | ICD-10-CM

## 2021-08-14 DIAGNOSIS — I10 Essential (primary) hypertension: Secondary | ICD-10-CM | POA: Diagnosis not present

## 2021-08-14 DIAGNOSIS — Z5320 Procedure and treatment not carried out because of patient's decision for unspecified reasons: Secondary | ICD-10-CM

## 2021-08-14 DIAGNOSIS — Z0000 Encounter for general adult medical examination without abnormal findings: Secondary | ICD-10-CM | POA: Diagnosis not present

## 2021-08-14 DIAGNOSIS — K219 Gastro-esophageal reflux disease without esophagitis: Secondary | ICD-10-CM

## 2021-08-14 LAB — TSH: TSH: 2.16 u[IU]/mL (ref 0.35–5.50)

## 2021-08-14 LAB — COMPREHENSIVE METABOLIC PANEL
ALT: 31 U/L (ref 0–53)
AST: 26 U/L (ref 0–37)
Albumin: 4.2 g/dL (ref 3.5–5.2)
Alkaline Phosphatase: 74 U/L (ref 39–117)
BUN: 19 mg/dL (ref 6–23)
CO2: 28 mEq/L (ref 19–32)
Calcium: 9.6 mg/dL (ref 8.4–10.5)
Chloride: 102 mEq/L (ref 96–112)
Creatinine, Ser: 0.98 mg/dL (ref 0.40–1.50)
GFR: 88.8 mL/min (ref >60.00)
Glucose, Bld: 155 mg/dL — ABNORMAL HIGH (ref 70–99)
Potassium: 4.2 mEq/L (ref 3.5–5.1)
Sodium: 137 mEq/L (ref 135–145)
Total Bilirubin: 0.7 mg/dL (ref 0.2–1.2)
Total Protein: 6.7 g/dL (ref 6.0–8.3)

## 2021-08-14 LAB — HEMOGLOBIN A1C: Hgb A1c MFr Bld: 7.2 % — ABNORMAL HIGH (ref 4.6–6.5)

## 2021-08-14 LAB — CBC
HCT: 42.6 % (ref 39.0–52.0)
Hemoglobin: 15 g/dL (ref 13.0–17.0)
MCHC: 35.2 g/dL (ref 30.0–36.0)
MCV: 87.8 fl (ref 78.0–100.0)
Platelets: 214 10*3/uL (ref 150.0–400.0)
RBC: 4.85 Mil/uL (ref 4.22–5.81)
RDW: 13.1 % (ref 11.5–15.5)
WBC: 10.8 10*3/uL — ABNORMAL HIGH (ref 4.0–10.5)

## 2021-08-14 LAB — URINALYSIS, MICROSCOPIC ONLY

## 2021-08-14 LAB — LDL CHOLESTEROL, DIRECT: Direct LDL: 123 mg/dL

## 2021-08-14 LAB — LIPID PANEL
Cholesterol: 214 mg/dL — ABNORMAL HIGH (ref 0–200)
HDL: 35.9 mg/dL — ABNORMAL LOW (ref >39.00)
NonHDL: 178.15
Total CHOL/HDL Ratio: 6
Triglycerides: 342 mg/dL — ABNORMAL HIGH (ref 0.0–149.0)
VLDL: 68.4 mg/dL — ABNORMAL HIGH (ref 0.0–40.0)

## 2021-08-14 LAB — PSA: PSA: 0.55 ng/mL (ref 0.10–4.00)

## 2021-08-14 NOTE — Assessment & Plan Note (Signed)
Currently maintained on Protonix.  Patient states that he does not take Protonix there are certain foods that we will get his reflux flared up.  States that he takes the Protonix as prescribed he does not have to modify food choices.  Continue Protonix

## 2021-08-14 NOTE — Assessment & Plan Note (Signed)
Currently maintained on lisinopril 20 mg.  Patient blood pressure within normal limits today.  Continue lisinopril 20 mg daily encourage lifestyle modifications

## 2021-08-14 NOTE — Patient Instructions (Signed)
Good to see you today Will be in touch about the lab results Call if you need any medications

## 2021-08-14 NOTE — Assessment & Plan Note (Signed)
Currently maintained on lovastatin 40 mg.  Encourage lifestyle modification.  We will check cholesterol levels and labs today.  Pending results.  Continue lovastatin 40 mg.

## 2021-08-14 NOTE — Progress Notes (Signed)
Established Patient Office Visit  Subjective:  Patient ID: Marvin Tapia, male    DOB: 01/11/1971  Age: 50 y.o. MRN: [id number]  CC:  Chief Complaint  Patient presents with   Transfer of Care    From [facility]   Annual Exam    HPI Marvin Tapia presents for complete physical and follow up of chronic conditions.  Immunizations: -Tetanus: Within past couple months. Injured toe in [place] and states updated then. -Influenza: does not flu shot -Covid-19: 2 dose no booster -Shingles: declined -Pneumonia: N/A    Diet: Fair diet.  Twice daily eater. Normally 50/50. Mean Bean and ice coffee Exercise: No regular exercise. On his feet all day every day  Eye exam: past year and half. States not interested because last time they dilated his eyes and it was bothersome Dental exam: has denture   Colonoscopy: Patient refused. Did offer IFOB and Cologuard. Still declined PSA: Due  Lung Cancer Screening: Completed in   Quit 6 weeks ago 20 years 1ppd in the past. Had a period of cessation for approx 10 years prior to this last stent  Past Medical History:  Diagnosis Date   Diabetes mellitus without complication (HCC)    GERD (gastroesophageal reflux disease)    Hyperlipidemia    Hypertension     Past Surgical History:  Procedure Laterality Date   HERNIA REPAIR     TONSILLECTOMY     WOUND EXPLORATION  10/01/2020   Procedure: ABDOMINAL WOUND EXPLORATION;  Surgeon: [name], MD;  Location: [facility];  Service: General;;    Family History  Problem Relation Age of Onset   Heart disease Father    Diabetes Brother    Hypertension Brother     Social History   Socioeconomic History   Marital status: Married    Spouse name: Not on file   Number of children: Not on file   Years of education: Not on file   Highest education level: Not on file  Occupational History   Not on file  Tobacco Use   Smoking status: Former    Packs/day: 1.00    Years: 20.00    Pack  years: 20.00    Types: Cigarettes    Quit date: 06/26/2021    Years since quitting: 0.1   Smokeless tobacco: Never  Substance and Sexual Activity   Alcohol use: Not Currently    Alcohol/week: 0.0 standard drinks   Drug use: Never   Sexual activity: Yes  Other Topics Concern   Not on file  Social History Narrative   Not on file   Social Determinants of Health   Financial Resource Strain: Not on file  Food Insecurity: Not on file  Transportation Needs: Not on file  Physical Activity: Not on file  Stress: Not on file  Social Connections: Not on file  Intimate Partner Violence: Not on file    Outpatient Medications Prior to Visit  Medication Sig Dispense Refill   glucose blood (FREESTYLE LITE) test strip 1 each by Other route in the morning and at bedtime. 200 each 1   lisinopril (ZESTRIL) 20 MG tablet TAKE 1 TABLET BY MOUTH ONCE DAILY 90 tablet 1   lovastatin (MEVACOR) 40 MG tablet TAKE 1 TABLET BY MOUTH AT BEDTIME 90 tablet 0   metFORMIN (GLUCOPHAGE) 1000 MG tablet TAKE 1 TABLET BY MOUTH TWICE DAILY 180 tablet 3   pantoprazole (PROTONIX) 40 MG tablet TAKE 1 TABLET BY MOUTH DAILY. 90 tablet 1   cephALEXin (KEFLEX)  500 MG capsule Take 1 capsule (500 mg total) by mouth 4 (four) times daily for 7 days. 28 capsule 0   doxycycline (VIBRAMYCIN) 50 MG capsule TAKE 2 CAPSULES (100 MG TOTAL) BY MOUTH 2 (TWO) TIMES DAILY. 40 capsule 0   HYDROcodone-acetaminophen (NORCO/VICODIN) 5-325 MG tablet Take 1 tablet by mouth every 6 (six) hours as needed.     No facility-administered medications prior to visit.    Allergies  Allergen Reactions   Sulfa Antibiotics     Unknown    ROS Review of Systems  Constitutional:  Negative for chills, fatigue and fever.  Eyes:  Negative for visual disturbance.  Respiratory:  Negative for cough, shortness of breath and wheezing.   Cardiovascular:  Negative for chest pain, palpitations and leg swelling.  Gastrointestinal:  Negative for abdominal pain,  blood in stool, diarrhea, nausea and vomiting.  Genitourinary:  Negative for dysuria, hematuria, penile pain, penile swelling and testicular pain.  Neurological:  Negative for dizziness, weakness, light-headedness and numbness.     Objective:    Physical Exam Vitals and nursing note reviewed.  Constitutional:      Appearance: He is obese.  HENT:     Right Ear: Tympanic membrane, ear canal and external ear normal. There is no impacted cerumen.     Left Ear: Tympanic membrane, ear canal and external ear normal. There is no impacted cerumen.     Mouth/Throat:     Mouth: Mucous membranes are moist.     Pharynx: Oropharynx is clear.  Eyes:     Extraocular Movements: Extraocular movements intact.     Pupils: Pupils are equal, round, and reactive to light.  Neck:     Vascular: No carotid bruit.  Cardiovascular:     Rate and Rhythm: Normal rate and regular rhythm.     Heart sounds: No murmur heard. Pulmonary:     Effort: Pulmonary effort is normal.     Breath sounds: Normal breath sounds.  Abdominal:     General: Bowel sounds are normal. There is no distension.     Tenderness: There is no abdominal tenderness.     Hernia: No hernia is present.       Comments: Recti diastasis.  Red line is scars from surgery   Lymphadenopathy:     Cervical: No cervical adenopathy.  Neurological:     General: No focal deficit present.     Mental Status: He is alert.     Motor: No weakness.     Gait: Gait normal.  Psychiatric:        Mood and Affect: Mood normal.        Behavior: Behavior normal.        Thought Content: Thought content normal.        Judgment: Judgment normal.    BP 104/62   Pulse 83   Temp (!) 96.6 F (35.9 C)   Resp 20   Ht 5' 10.25" (1.784 m)   Wt 218 lb 8 oz (99.1 kg)   SpO2 95%   BMI 31.13 kg/m  Wt Readings from Last 3 Encounters:  08/14/21 218 lb 8 oz (99.1 kg)  10/14/20 211 lb (95.7 kg)  09/29/20 208 lb (94.3 kg)     Health Maintenance Due  Topic Date  Due   PNEUMOCOCCAL POLYSACCHARIDE VACCINE AGE 64-64 HIGH RISK  Never done   COVID-19 Vaccine (1) Never done   Pneumococcal Vaccine 23-21 Years old (1 - PCV) Never done   FOOT EXAM  Never done  HIV Screening  Never done   Hepatitis C Screening  Never done   TETANUS/TDAP  Never done   COLONOSCOPY (Pts 45-1624yrs Insurance coverage will need to be confirmed)  Never done   Zoster Vaccines- Shingrix (1 of 2) Never done   HEMOGLOBIN A1C  02/23/2021   OPHTHALMOLOGY EXAM  06/26/2021   INFLUENZA VACCINE  07/27/2021    There are no preventive care reminders to display for this patient.  No results found for: TSH Lab Results  Component Value Date   WBC 12.7 (H) 09/11/2011   HGB 14.7 09/11/2011   HCT 39.2 09/11/2011   MCV 84.3 09/11/2011   PLT 212 09/11/2011   Lab Results  Component Value Date   NA 139 08/25/2020   K 4.1 08/25/2020   CO2 29 08/25/2020   GLUCOSE 126 (H) 08/25/2020   BUN 15 08/25/2020   CREATININE 0.86 08/25/2020   BILITOT 0.4 08/25/2020   ALKPHOS 73 08/25/2020   AST 20 08/25/2020   ALT 17 08/25/2020   PROT 7.0 08/25/2020   ALBUMIN 4.2 08/25/2020   CALCIUM 9.5 08/25/2020   ANIONGAP 10 07/09/2019   GFR 93.64 08/25/2020   Lab Results  Component Value Date   CHOL 207 (H) 08/25/2020   Lab Results  Component Value Date   HDL 33.70 (L) 08/25/2020   Lab Results  Component Value Date   LDLCALC 110 (H) 07/09/2019   Lab Results  Component Value Date   TRIG 219.0 (H) 08/25/2020   Lab Results  Component Value Date   CHOLHDL 6 08/25/2020   Lab Results  Component Value Date   HGBA1C 6.4 08/25/2020      Assessment & Plan:   Problem List Items Addressed This Visit       Cardiovascular and Mediastinum   Essential hypertension    Currently maintained on lisinopril 20 mg.  Patient blood pressure within normal limits today.  Continue lisinopril 20 mg daily encourage lifestyle modifications      Relevant Orders   Comprehensive metabolic panel   CBC      Digestive   GERD (gastroesophageal reflux disease)    Currently maintained on Protonix.  Patient states that he does not take Protonix there are certain foods that we will get his reflux flared up.  States that he takes the Protonix as prescribed he does not have to modify food choices.  Continue Protonix        Endocrine   Type 2 diabetes mellitus without complication, without long-term current use of insulin (HCC)    Currently maintained on metformin.  Patient sliced last A1c was 6.4%.  We will check today in office pending lab.  Continue metformin as prescribed.  Patient did admit that he has been enjoying ice coffees and "mean bean coffees" and feels this will have impact on his A1c.      Relevant Orders   Hemoglobin A1c   Comprehensive metabolic panel   CBC     Other   Pure hypercholesterolemia    Currently maintained on lovastatin 40 mg.  Encourage lifestyle modification.  We will check cholesterol levels and labs today.  Pending results.  Continue lovastatin 40 mg.      Relevant Orders   Lipid panel   Other Visit Diagnoses     Preventative health care    -  Primary   Relevant Orders   TSH   Comprehensive metabolic panel   CBC   Urine Microscopic   Colonoscopy refused  No orders of the defined types were placed in this encounter.  Non fasting  Follow-up: Return in about 1 year (around 08/14/2022).   This visit occurred during the SARS-CoV-2 public health emergency.  Safety protocols were in place, including screening questions prior to the visit, additional usage of staff PPE, and extensive cleaning of exam room while observing appropriate contact time as indicated for disinfecting solutions.   [name], NP

## 2021-08-14 NOTE — Assessment & Plan Note (Signed)
Discussed with patient's the importance of screening and preventative medicine.  Patient was not interested in traditional colonoscopy.  I did discuss different options including Cologuard since he has no high risk features.  Did explain that it would be once every 3 years.  He declined.  Also discussed iFOB risks and benefits with that.  Patient declined.

## 2021-08-14 NOTE — Assessment & Plan Note (Signed)
Currently maintained on metformin.  Patient sliced last A1c was 6.4%.  We will check today in office pending lab.  Continue metformin as prescribed.  Patient did admit that he has been enjoying ice coffees and "mean bean coffees" and feels this will have impact on his A1c.

## 2021-08-24 ENCOUNTER — Ambulatory Visit: Payer: No Typology Code available for payment source | Admitting: Podiatry

## 2021-09-23 ENCOUNTER — Other Ambulatory Visit (HOSPITAL_COMMUNITY): Payer: Self-pay

## 2021-09-23 MED FILL — Lisinopril Tab 20 MG: ORAL | 90 days supply | Qty: 90 | Fill #1 | Status: AC

## 2021-10-30 ENCOUNTER — Other Ambulatory Visit (HOSPITAL_COMMUNITY): Payer: Self-pay

## 2021-10-30 MED FILL — Pantoprazole Sodium EC Tab 40 MG (Base Equiv): ORAL | 90 days supply | Qty: 90 | Fill #1 | Status: AC

## 2021-11-07 ENCOUNTER — Other Ambulatory Visit (HOSPITAL_COMMUNITY): Payer: Self-pay

## 2021-12-29 ENCOUNTER — Telehealth: Payer: Self-pay | Admitting: Nurse Practitioner

## 2021-12-29 NOTE — Telephone Encounter (Signed)
Was alerted that patient never heard from [place] on lab results. I see we attempted and even mailed a letter. Can we try and reach out to the patient and apologize for the confusion and a I believe he is suppose to have a follow up off my original note

## 2021-12-29 NOTE — Telephone Encounter (Signed)
Called patient again. Not able to leave a message due to voicemail not set up. Yes, I have called patient 2 times in the fall 2022, called patient's wife left message, sent mychart message and mailed letter.

## 2022-01-04 ENCOUNTER — Other Ambulatory Visit: Payer: Self-pay | Admitting: Internal Medicine

## 2022-01-04 NOTE — Telephone Encounter (Signed)
Requested Prescriptions  Pending Prescriptions Disp Refills   metFORMIN (GLUCOPHAGE) 1000 MG tablet 180 tablet 3    Sig: TAKE 1 TABLET BY MOUTH TWICE DAILY     There is no refill protocol information for this order     lovastatin (MEVACOR) 40 MG tablet 90 tablet 0    Sig: TAKE 1 TABLET BY MOUTH AT BEDTIME     There is no refill protocol information for this order

## 2022-01-06 ENCOUNTER — Other Ambulatory Visit (HOSPITAL_COMMUNITY): Payer: Self-pay

## 2022-01-07 ENCOUNTER — Other Ambulatory Visit: Payer: Self-pay | Admitting: Internal Medicine

## 2022-01-07 ENCOUNTER — Other Ambulatory Visit (HOSPITAL_COMMUNITY): Payer: Self-pay

## 2022-01-07 ENCOUNTER — Encounter: Payer: Self-pay | Admitting: Nurse Practitioner

## 2022-01-09 ENCOUNTER — Other Ambulatory Visit (HOSPITAL_COMMUNITY): Payer: Self-pay

## 2022-01-09 MED ORDER — LOVASTATIN 40 MG PO TABS
ORAL_TABLET | Freq: Every day | ORAL | 0 refills | Status: DC
  Filled 2022-01-09: qty 90, 90d supply, fill #0

## 2022-01-09 MED ORDER — LISINOPRIL 20 MG PO TABS
ORAL_TABLET | Freq: Every day | ORAL | 1 refills | Status: DC
  Filled 2022-01-09: qty 90, 90d supply, fill #0

## 2022-01-12 ENCOUNTER — Other Ambulatory Visit (HOSPITAL_COMMUNITY): Payer: Self-pay

## 2022-01-12 MED ORDER — LOVASTATIN 40 MG PO TABS
40.0000 mg | ORAL_TABLET | Freq: Every day | ORAL | 0 refills | Status: DC
  Filled 2022-01-12 – 2022-05-06 (×2): qty 90, 90d supply, fill #0

## 2022-01-12 MED ORDER — LISINOPRIL 20 MG PO TABS
20.0000 mg | ORAL_TABLET | Freq: Every day | ORAL | 0 refills | Status: DC
  Filled 2022-01-12 – 2022-06-01 (×5): qty 90, 90d supply, fill #0

## 2022-01-12 NOTE — Addendum Note (Signed)
Addended by: Consuella Lose on: 01/12/2022 09:48 AM   Modules accepted: Orders

## 2022-01-25 ENCOUNTER — Encounter: Payer: Self-pay | Admitting: Nurse Practitioner

## 2022-01-25 ENCOUNTER — Ambulatory Visit (INDEPENDENT_AMBULATORY_CARE_PROVIDER_SITE_OTHER): Payer: No Typology Code available for payment source | Admitting: Nurse Practitioner

## 2022-01-25 ENCOUNTER — Other Ambulatory Visit: Payer: Self-pay

## 2022-01-25 ENCOUNTER — Other Ambulatory Visit (HOSPITAL_COMMUNITY): Payer: Self-pay

## 2022-01-25 VITALS — BP 132/86 | HR 75 | Temp 98.0°F | Resp 14 | Ht 70.25 in | Wt 224.4 lb

## 2022-01-25 DIAGNOSIS — M79671 Pain in right foot: Secondary | ICD-10-CM | POA: Diagnosis not present

## 2022-01-25 DIAGNOSIS — I10 Essential (primary) hypertension: Secondary | ICD-10-CM | POA: Diagnosis not present

## 2022-01-25 DIAGNOSIS — E119 Type 2 diabetes mellitus without complications: Secondary | ICD-10-CM | POA: Diagnosis not present

## 2022-01-25 LAB — POCT GLYCOSYLATED HEMOGLOBIN (HGB A1C): Hemoglobin A1C: 8.9 % — AB (ref 4.0–5.6)

## 2022-01-25 MED ORDER — DAPAGLIFLOZIN PROPANEDIOL 5 MG PO TABS
5.0000 mg | ORAL_TABLET | Freq: Every day | ORAL | 1 refills | Status: DC
  Filled 2022-01-25: qty 90, 90d supply, fill #0

## 2022-01-25 NOTE — Patient Instructions (Signed)
Nice to see you today I need you to take 1,000 mg tablet in the morning and then another in the evening. I am adding on farxgia 5mg  one tablet in the morning. I want to see you in 3 months for a office follow up FASTING so I can check some labs Follow up 3 months, sooner if you need me  Try checking you sugar a little more often for me if you can get about 4 times a week I will be happy. Of course check it if you feel off for some reason

## 2022-01-25 NOTE — Assessment & Plan Note (Signed)
Patient has some weight gain since last office visit.  He did admit that he has been eating poorly over the holiday season.  Checking his blood glucose infrequently.  And was only taking 1000 mg of metformin daily.  We will increase metformin to 1000 mg twice daily as it was written.  And add on Farxiga 5 mg.  Will defer labs today since he is not fasting.  Next office visit check whole panel of labs inclusive of lipids.  Did suggest patient have fasting appointment if not we can always get labs prior to his visit and we can be fasting

## 2022-01-25 NOTE — Assessment & Plan Note (Signed)
Nonspecific foot pain on the right side between great and second toe.  States worse with being on his feet.  Does not have very supportive shoes on in office.  Has not tried anything over-the-counter.  Recommend that he try supportive footwear and over-the-counter Tylenol as needed to see if this resolves

## 2022-01-25 NOTE — Assessment & Plan Note (Signed)
Blood pressure well controlled in office today.  Continue taking lisinopril 20 mg as prescribed

## 2022-01-25 NOTE — Progress Notes (Signed)
Established Patient Office Visit  Subjective:  Patient ID: Marvin Tapia, male    DOB: [birth year]  Age: 51 y.o. MRN: [id number]  CC:  Chief Complaint  Patient presents with   Diabetes    Follow up-sugar readings have been between 100 and 150-these levels are some fasting and some after eating     HPI Marvin Tapia presents for DM follow  States that he checks it every couple. Has had a few times that he felt it was lower because he had not eaten. High reading of 150. States that he has not eaten well over the holidays. States that he does not have a structured exercise program.  Foot pain: right sided intermittent. Worse when he is on his feet. States that he has not tried anything over the counter thus far. Located between the great and second toe. Been happening for several months  Past Medical History:  Diagnosis Date   Diabetes mellitus without complication (HCC)    GERD (gastroesophageal reflux disease)    Hyperlipidemia    Hypertension     Past Surgical History:  Procedure Laterality Date   HERNIA REPAIR     TONSILLECTOMY     WOUND EXPLORATION  10/01/2020   Procedure: ABDOMINAL WOUND EXPLORATION;  Surgeon: Aviva Signs, MD;  Location: [facility];  Service: General;;    Family History  Problem Relation Age of Onset   Hyperlipidemia Father    Heart disease Father    Diabetes Brother    Hypertension Brother     Social History   Socioeconomic History   Marital status: Married    Spouse name: Not on file   Number of children: Not on file   Years of education: Not on file   Highest education level: Not on file  Occupational History   Not on file  Tobacco Use   Smoking status: Former    Packs/day: 1.00    Years: 20.00    Pack years: 20.00    Types: Cigarettes    Quit date: 06/26/2021    Years since quitting: 0.5   Smokeless tobacco: Never  Substance and Sexual Activity   Alcohol use: Not Currently    Alcohol/week: 0.0 standard drinks   Drug use:  Never   Sexual activity: Yes  Other Topics Concern   Not on file  Social History Narrative   Not on file   Social Determinants of Health   Financial Resource Strain: Not on file  Food Insecurity: Not on file  Transportation Needs: Not on file  Physical Activity: Not on file  Stress: Not on file  Social Connections: Not on file  Intimate Partner Violence: Not on file    Outpatient Medications Prior to Visit  Medication Sig Dispense Refill   aspirin 81 MG EC tablet      glucose blood (FREESTYLE LITE) test strip 1 each by Other route in the morning and at bedtime. 200 each 1   lisinopril (ZESTRIL) 20 MG tablet Take 1 tablet (20 mg total) by mouth daily. 90 tablet 0   lovastatin (MEVACOR) 40 MG tablet Take 1 tablet (40 mg total) by mouth at bedtime. 90 tablet 0   metFORMIN (GLUCOPHAGE) 1000 MG tablet TAKE 1 TABLET BY MOUTH TWICE DAILY 180 tablet 3   pantoprazole (PROTONIX) 40 MG tablet TAKE 1 TABLET BY MOUTH DAILY. 90 tablet 1   No facility-administered medications prior to visit.    Allergies  Allergen Reactions   Sulfa Antibiotics     Unknown  ROS Review of Systems  Constitutional:  Negative for chills and fever.  Respiratory:  Negative for cough and shortness of breath.   Cardiovascular:  Negative for chest pain.  Gastrointestinal:  Negative for abdominal pain, diarrhea, nausea and vomiting.  Endocrine: Negative for polydipsia, polyphagia and polyuria.  Genitourinary:  Negative for dysuria and hematuria.  Musculoskeletal:  Positive for arthralgias.  Neurological:  Negative for numbness.     Objective:    Physical Exam Vitals and nursing note reviewed.  Constitutional:      Appearance: He is obese.  Neck:     Vascular: No carotid bruit.  Cardiovascular:     Rate and Rhythm: Normal rate and regular rhythm.     Pulses: Normal pulses.          Dorsalis pedis pulses are 2+ on the right side and 2+ on the left side.     Heart sounds: Normal heart sounds.   Pulmonary:     Effort: Pulmonary effort is normal.     Breath sounds: Normal breath sounds.  Abdominal:     General: Bowel sounds are normal.  Musculoskeletal:     Right lower leg: No edema.     Left lower leg: No edema.       Feet:  Feet:     Right foot:     Skin integrity: Dry skin present.     Toenail Condition: Right toenails are abnormally thick.     Left foot:     Skin integrity: Dry skin present.     Toenail Condition: Left toenails are abnormally thick.  Lymphadenopathy:     Cervical: No cervical adenopathy.  Skin:    General: Skin is warm.  Neurological:     Mental Status: He is alert.    BP 132/86    Pulse 75    Temp 98 F (36.7 C)    Resp 14    Ht 5' 10.25" (1.784 m)    Wt 224 lb 6 oz (101.8 kg)    SpO2 97%    BMI 31.97 kg/m  Wt Readings from Last 3 Encounters:  01/25/22 224 lb 6 oz (101.8 kg)  08/14/21 218 lb 8 oz (99.1 kg)  10/14/20 211 lb (95.7 kg)     Health Maintenance Due  Topic Date Due   FOOT EXAM  Never done   HIV Screening  Never done   Hepatitis C Screening  Never done   TETANUS/TDAP  Never done   COLONOSCOPY (Pts 45-48yrs Insurance coverage will need to be confirmed)  Never done   Zoster Vaccines- Shingrix (1 of 2) Never done   COVID-19 Vaccine (3 - Booster for Moderna series) 03/15/2021   OPHTHALMOLOGY EXAM  06/26/2021    There are no preventive care reminders to display for this patient.  Lab Results  Component Value Date   TSH 2.16 08/14/2021   Lab Results  Component Value Date   WBC 10.8 (H) 08/14/2021   HGB 15.0 08/14/2021   HCT 42.6 08/14/2021   MCV 87.8 08/14/2021   PLT 214.0 08/14/2021   Lab Results  Component Value Date   NA 137 08/14/2021   K 4.2 08/14/2021   CO2 28 08/14/2021   GLUCOSE 155 (H) 08/14/2021   BUN 19 08/14/2021   CREATININE 0.98 08/14/2021   BILITOT 0.7 08/14/2021   ALKPHOS 74 08/14/2021   AST 26 08/14/2021   ALT 31 08/14/2021   PROT 6.7 08/14/2021   ALBUMIN 4.2 08/14/2021   CALCIUM 9.6  08/14/2021  ANIONGAP 10 07/09/2019   GFR 88.80 08/14/2021   Lab Results  Component Value Date   CHOL 214 (H) 08/14/2021   Lab Results  Component Value Date   HDL 35.90 (L) 08/14/2021   Lab Results  Component Value Date   LDLCALC 110 (H) 07/09/2019   Lab Results  Component Value Date   TRIG 342.0 (H) 08/14/2021   Lab Results  Component Value Date   CHOLHDL 6 08/14/2021   Lab Results  Component Value Date   HGBA1C 7.2 (H) 08/14/2021      Assessment & Plan:   Problem List Items Addressed This Visit       Cardiovascular and Mediastinum   Essential hypertension    Blood pressure well controlled in office today.  Continue taking lisinopril 20 mg as prescribed      Relevant Medications   aspirin 81 MG EC tablet     Endocrine   Type 2 diabetes mellitus without complication, without long-term current use of insulin ([place]) - Primary    Patient has some weight gain since last office visit.  He did admit that he has been eating poorly over the holiday season.  Checking his blood glucose infrequently.  And was only taking 1000 mg of metformin daily.  We will increase metformin to 1000 mg twice daily as it was written.  And add on Farxiga 5 mg.  Will defer labs today since he is not fasting.  Next office visit check whole panel of labs inclusive of lipids.  Did suggest patient have fasting appointment if not we can always get labs prior to his visit and we can be fasting      Relevant Medications   aspirin 81 MG EC tablet   dapagliflozin propanediol (FARXIGA) 5 MG TABS tablet   Other Relevant Orders   POCT glycosylated hemoglobin (Hb A1C) (Completed)     Other   Right foot pain    Nonspecific foot pain on the right side between great and second toe.  States worse with being on his feet.  Does not have very supportive shoes on in office.  Has not tried anything over-the-counter.  Recommend that he try supportive footwear and over-the-counter Tylenol as needed to see if this  resolves       Meds ordered this encounter  Medications   dapagliflozin propanediol (FARXIGA) 5 MG TABS tablet    Sig: Take 1 tablet (5 mg total) by mouth daily before breakfast.    Dispense:  90 tablet    Refill:  1    Order Specific Question:   Supervising Provider    Answer:   Loura Pardon A [[id number]]    Follow-up: Return in about 3 months (around 04/25/2022) for fasting recheck.   This visit occurred during the SARS-CoV-2 public health emergency.  Safety protocols were in place, including screening questions prior to the visit, additional usage of staff PPE, and extensive cleaning of exam room while observing appropriate contact time as indicated for disinfecting solutions.   Romilda Garret, NP

## 2022-02-19 ENCOUNTER — Other Ambulatory Visit (HOSPITAL_COMMUNITY): Payer: Self-pay

## 2022-02-22 ENCOUNTER — Other Ambulatory Visit (HOSPITAL_COMMUNITY): Payer: Self-pay

## 2022-02-22 ENCOUNTER — Encounter: Payer: Self-pay | Admitting: Nurse Practitioner

## 2022-02-22 ENCOUNTER — Other Ambulatory Visit: Payer: Self-pay | Admitting: Internal Medicine

## 2022-02-23 ENCOUNTER — Other Ambulatory Visit: Payer: Self-pay | Admitting: Nurse Practitioner

## 2022-02-23 ENCOUNTER — Other Ambulatory Visit (HOSPITAL_COMMUNITY): Payer: Self-pay

## 2022-02-23 NOTE — Telephone Encounter (Signed)
Patient no longer under prescriber care Requested Prescriptions  Pending Prescriptions Disp Refills   metFORMIN (GLUCOPHAGE) 1000 MG tablet 180 tablet 3    Sig: TAKE 1 TABLET BY MOUTH TWICE DAILY     Endocrinology:  Diabetes - Biguanides Failed - 02/23/2022 10:21 AM      Failed - HBA1C is between 0 and 7.9 and within 180 days    Hemoglobin A1C  Date Value Ref Range Status  01/25/2022 8.9 (A) 4.0 - 5.6 % Final   Hgb A1c MFr Bld  Date Value Ref Range Status  08/14/2021 7.2 (H) 4.6 - 6.5 % Final    Comment:    Glycemic Control Guidelines for People with Diabetes:Non Diabetic:  <6%Goal of Therapy: <7%Additional Action Suggested:  >8%          Failed - B12 Level in normal range and within 720 days    No results found for: VITAMINB12       Failed - Valid encounter within last 6 months    Recent Outpatient Visits   None     Future Appointments            In 1 month Cable, Alyson Locket, NP [facility] [place], [place]           Failed - CBC within normal limits and completed in the last 12 months    WBC  Date Value Ref Range Status  08/14/2021 10.8 (H) 4.0 - 10.5 K/uL Final   RBC  Date Value Ref Range Status  08/14/2021 4.85 4.22 - 5.81 Mil/uL Final   Hemoglobin  Date Value Ref Range Status  08/14/2021 15.0 13.0 - 17.0 g/dL Final   HCT  Date Value Ref Range Status  08/14/2021 42.6 39.0 - 52.0 % Final   MCHC  Date Value Ref Range Status  08/14/2021 35.2 30.0 - 36.0 g/dL Final   [facility]  Date Value Ref Range Status  09/11/2011 31.6 26.0 - 34.0 pg Final   MCV  Date Value Ref Range Status  08/14/2021 87.8 78.0 - 100.0 fl Final   No results found for: PLTCOUNTKUC, LABPLAT, POCPLA RDW  Date Value Ref Range Status  08/14/2021 13.1 11.5 - 15.5 % Final         Passed - Cr in normal range and within 360 days    Creatinine, Ser  Date Value Ref Range Status  08/14/2021 0.98 0.40 - 1.50 mg/dL Final         Passed - eGFR in normal range and within 360 days     GFR calc Af Amer  Date Value Ref Range Status  07/09/2019 >60 >60 mL/min Final   GFR calc non Af Amer  Date Value Ref Range Status  07/09/2019 >60 >60 mL/min Final   GFR  Date Value Ref Range Status  08/14/2021 88.80 >60.00 mL/min Final    Comment:    Calculated using the CKD-EPI Creatinine Equation (2021)

## 2022-02-24 ENCOUNTER — Other Ambulatory Visit (HOSPITAL_COMMUNITY): Payer: Self-pay

## 2022-02-24 MED ORDER — METFORMIN HCL 1000 MG PO TABS
ORAL_TABLET | Freq: Two times a day (BID) | ORAL | 3 refills | Status: DC
  Filled 2022-02-24: qty 180, 90d supply, fill #0
  Filled 2022-07-12: qty 180, 90d supply, fill #1
  Filled 2022-11-23: qty 180, 90d supply, fill #2

## 2022-04-02 ENCOUNTER — Other Ambulatory Visit (HOSPITAL_COMMUNITY): Payer: Self-pay

## 2022-04-21 ENCOUNTER — Other Ambulatory Visit: Payer: Self-pay | Admitting: Internal Medicine

## 2022-04-21 ENCOUNTER — Other Ambulatory Visit (HOSPITAL_COMMUNITY): Payer: Self-pay

## 2022-04-22 ENCOUNTER — Other Ambulatory Visit (HOSPITAL_COMMUNITY): Payer: Self-pay

## 2022-04-22 MED ORDER — PANTOPRAZOLE SODIUM 40 MG PO TBEC
DELAYED_RELEASE_TABLET | Freq: Every day | ORAL | 0 refills | Status: DC
  Filled 2022-04-22: qty 90, 90d supply, fill #0

## 2022-04-22 NOTE — Telephone Encounter (Signed)
? ?  Notes to clinic: RF request sent to [facility]- forwarded to PCP office ? ?Requested Prescriptions  ?Pending Prescriptions Disp Refills  ? pantoprazole (PROTONIX) 40 MG tablet 90 tablet 1  ?  Sig: TAKE 1 TABLET BY MOUTH DAILY.  ?  ? Gastroenterology: Proton Pump Inhibitors Failed - 04/21/2022  7:11 AM  ?  ?  Failed - Valid encounter within last 12 months  ?  Recent Outpatient Visits   ?None ?  ?  ?Future Appointments   ? ?        ? Tomorrow Eden Emms, NP [facility] at [place], [place]  ? ?  ? ? ?  ?  ?  ? ? ? ?Requested Prescriptions  ?Pending Prescriptions Disp Refills  ? pantoprazole (PROTONIX) 40 MG tablet 90 tablet 1  ?  Sig: TAKE 1 TABLET BY MOUTH DAILY.  ?  ? Gastroenterology: Proton Pump Inhibitors Failed - 04/21/2022  7:11 AM  ?  ?  Failed - Valid encounter within last 12 months  ?  Recent Outpatient Visits   ?None ?  ?  ?Future Appointments   ? ?        ? Tomorrow Eden Emms, NP [facility] at [place], [place]  ? ?  ? ? ?  ?  ?  ? ? ? ?

## 2022-04-23 ENCOUNTER — Ambulatory Visit (INDEPENDENT_AMBULATORY_CARE_PROVIDER_SITE_OTHER): Payer: No Typology Code available for payment source | Admitting: Nurse Practitioner

## 2022-04-23 ENCOUNTER — Other Ambulatory Visit (HOSPITAL_COMMUNITY): Payer: Self-pay

## 2022-04-23 VITALS — BP 110/84 | HR 70 | Temp 97.6°F | Resp 14 | Ht 70.25 in | Wt 221.5 lb

## 2022-04-23 DIAGNOSIS — I10 Essential (primary) hypertension: Secondary | ICD-10-CM

## 2022-04-23 DIAGNOSIS — E7800 Pure hypercholesterolemia, unspecified: Secondary | ICD-10-CM

## 2022-04-23 DIAGNOSIS — E119 Type 2 diabetes mellitus without complications: Secondary | ICD-10-CM

## 2022-04-23 DIAGNOSIS — L989 Disorder of the skin and subcutaneous tissue, unspecified: Secondary | ICD-10-CM | POA: Diagnosis not present

## 2022-04-23 LAB — LIPID PANEL
Cholesterol: 189 mg/dL (ref 0–200)
HDL: 42.9 mg/dL (ref >39.00)
LDL Cholesterol: 111 mg/dL — ABNORMAL HIGH (ref 0–99)
NonHDL: 146.22
Total CHOL/HDL Ratio: 4
Triglycerides: 175 mg/dL — ABNORMAL HIGH (ref 0.0–149.0)
VLDL: 35 mg/dL (ref 0.0–40.0)

## 2022-04-23 LAB — CBC
HCT: 43.5 % (ref 39.0–52.0)
Hemoglobin: 15.2 g/dL (ref 13.0–17.0)
MCHC: 35 g/dL (ref 30.0–36.0)
MCV: 87.7 fl (ref 78.0–100.0)
Platelets: 213 10*3/uL (ref 150.0–400.0)
RBC: 4.96 Mil/uL (ref 4.22–5.81)
RDW: 12.8 % (ref 11.5–15.5)
WBC: 8.6 10*3/uL (ref 4.0–10.5)

## 2022-04-23 LAB — COMPREHENSIVE METABOLIC PANEL
ALT: 25 U/L (ref 0–53)
AST: 26 U/L (ref 0–37)
Albumin: 4.3 g/dL (ref 3.5–5.2)
Alkaline Phosphatase: 72 U/L (ref 39–117)
BUN: 16 mg/dL (ref 6–23)
CO2: 30 mEq/L (ref 19–32)
Calcium: 9.3 mg/dL (ref 8.4–10.5)
Chloride: 102 mEq/L (ref 96–112)
Creatinine, Ser: 1.02 mg/dL (ref 0.40–1.50)
GFR: 84.23 mL/min (ref >60.00)
Glucose, Bld: 162 mg/dL — ABNORMAL HIGH (ref 70–99)
Potassium: 4.4 mEq/L (ref 3.5–5.1)
Sodium: 137 mEq/L (ref 135–145)
Total Bilirubin: 0.6 mg/dL (ref 0.2–1.2)
Total Protein: 7 g/dL (ref 6.0–8.3)

## 2022-04-23 LAB — MICROALBUMIN / CREATININE URINE RATIO
Creatinine,U: 108.3 mg/dL
Microalb Creat Ratio: 1.9 mg/g (ref 0.0–30.0)
Microalb, Ur: 2.1 mg/dL — ABNORMAL HIGH (ref 0.0–1.9)

## 2022-04-23 LAB — POCT GLYCOSYLATED HEMOGLOBIN (HGB A1C): Hemoglobin A1C: 7.4 % — AB (ref 4.0–5.6)

## 2022-04-23 MED ORDER — DAPAGLIFLOZIN PROPANEDIOL 10 MG PO TABS
10.0000 mg | ORAL_TABLET | Freq: Every day | ORAL | 1 refills | Status: DC
  Filled 2022-04-23: qty 90, 90d supply, fill #0
  Filled 2022-08-18: qty 90, 90d supply, fill #1

## 2022-04-23 MED ORDER — FREESTYLE LITE TEST VI STRP
1.0000 | ORAL_STRIP | Freq: Two times a day (BID) | 12 refills | Status: AC
Start: 2022-04-23 — End: ?
  Filled 2022-04-23: qty 200, 90d supply, fill #0

## 2022-04-23 NOTE — Assessment & Plan Note (Signed)
Lesion to the left forearm has been present for approximately 6 months or more that is nonhealing.  Ambulatory referral to dermatology. ?

## 2022-04-23 NOTE — Assessment & Plan Note (Signed)
Patient currently maintained on lisinopril 20 mg daily.  Patient's blood pressure within normal limits in office today.  Patient checks blood pressure on a as needed basis at home.  Continue lisinopril 20 mg daily ?

## 2022-04-23 NOTE — Assessment & Plan Note (Signed)
Patient was currently maintained on metformin 1000 mg twice daily and Farxiga 5 mg daily.  Patient's A1c 7.4 in office a decrease from 8.9.  We will increase patient's Farxiga from 5 mg to 10 mg.  Patient made aware new prescription sent to pharmacy. ?

## 2022-04-23 NOTE — Progress Notes (Signed)
? ?Established Patient Office Visit ? ?Subjective   ?Patient ID: Marvin Tapia, male    DOB: 02-07-1971  Age: 51 y.o. MRN: [id number] ? ?Chief Complaint  ?Patient presents with  ? Follow-up  ? ? ?HPI ? ?DM: has been checking his glucose 2-3 times a day. Sugar this morning was 206.  ?High Sugar: 250-260s ?Low sugar: 114 ?Cut out sodas just drinking water now. Has been using sugar free flavor packs and powerade zeros. ?Will eat 2 meals a day.  ?Lunch is hamburger or sandwich. Supper is home cooked. States a full meal. Had pot roast with carrots and potatoes the other night. ?Exercise is with employment but nothing outside of that. ? ?HTN: Blood pressure looks good. States he will check the blood pressure if he feels weird ? ?HLD: currently on lovastatin and no current exercise program  ? ?Skin lesion: Located to the left distal posterior lateral forearm (radial side).  Patient states been there at least 6 months will scab over for an open but never fully healed. ? ? ? ?Review of Systems  ?Constitutional:  Negative for chills and fever.  ?Respiratory:  Negative for shortness of breath.   ?Cardiovascular:  Negative for chest pain and leg swelling.  ?Gastrointestinal:  Negative for abdominal pain, diarrhea, nausea and vomiting.  ?     BM daily ?  ?Genitourinary:  Negative for dysuria and frequency.  ?     Nocturia x 1  ?Neurological:  Negative for dizziness and headaches.  ? ?  ?Objective:  ?  ? ?BP 110/84   Pulse 70   Temp 97.6 ?F (36.4 ?C)   Resp 14   Ht 5' 10.25" (1.784 m)   Wt 221 lb 8 oz (100.5 kg)   SpO2 96%   BMI 31.56 kg/m?  ?Wt Readings from Last 3 Encounters:  ?04/23/22 221 lb 8 oz (100.5 kg)  ?01/25/22 224 lb 6 oz (101.8 kg)  ?08/14/21 218 lb 8 oz (99.1 kg)  ? ?  ? ?Physical Exam ?Vitals and nursing note reviewed.  ?Constitutional:   ?   Appearance: Normal appearance.  ?Neck:  ?   Vascular: No carotid bruit.  ?Cardiovascular:  ?   Rate and Rhythm: Normal rate and regular rhythm.  ?   Pulses:     ?      Dorsalis pedis pulses are 2+ on the right side and 2+ on the left side.  ?     Posterior tibial pulses are 1+ on the right side and 1+ on the left side.  ?   Heart sounds: Normal heart sounds.  ?Pulmonary:  ?   Effort: Pulmonary effort is normal.  ?   Breath sounds: Normal breath sounds.  ?Abdominal:  ?   General: Bowel sounds are normal.  ?Musculoskeletal:  ?   Right lower leg: No edema.  ?   Left lower leg: No edema.  ?Feet:  ?   Right foot:  ?   Skin integrity: Skin integrity normal.  ?   Left foot:  ?   Skin integrity: Skin integrity normal.  ?   Comments: Did have some dried skin to the area between the 4th and 5th toe on the left foot ?Skin: ?   Comments: 1.4 cm x 1cm raised and irregular. Present for 6 months and non healing. See clinical photo  ?Neurological:  ?   Mental Status: He is alert.  ? ? ? ? ?Results for orders placed or performed in visit on 04/23/22  ?  POCT glycosylated hemoglobin (Hb A1C)  ?Result Value Ref Range  ? Hemoglobin A1C 7.4 (A) 4.0 - 5.6 %  ? HbA1c POC (<> result, manual entry)    ? HbA1c, POC (prediabetic range)    ? HbA1c, POC (controlled diabetic range)    ? ? ? ? ?The 10-year ASCVD risk score (Arnett DK, et al., 2019) is: 10.7% ? ?  ?Assessment & Plan:  ? ?Problem List Items Addressed This Visit   ? ?  ? Cardiovascular and Mediastinum  ? Essential hypertension  ?  Patient currently maintained on lisinopril 20 mg daily.  Patient's blood pressure within normal limits in office today.  Patient checks blood pressure on a as needed basis at home.  Continue lisinopril 20 mg daily ? ?  ?  ? Relevant Orders  ? CBC  ? Comprehensive metabolic panel  ? Lipid panel  ?  ? Endocrine  ? Type 2 diabetes mellitus without complication, without long-term current use of insulin (HCC) - Primary  ?  Patient was currently maintained on metformin 1000 mg twice daily and Farxiga 5 mg daily.  Patient's A1c 7.4 in office a decrease from 8.9.  We will increase patient's Farxiga from 5 mg to 10 mg.  Patient  made aware new prescription sent to pharmacy. ? ?  ?  ? Relevant Medications  ? glucose blood (FREESTYLE LITE) test strip  ? dapagliflozin propanediol (FARXIGA) 10 MG TABS tablet  ? Other Relevant Orders  ? Microalbumin/Creatinine Ratio, Urine  ? POCT glycosylated hemoglobin (Hb A1C) (Completed)  ? Ambulatory referral to Ophthalmology  ? CBC  ? Comprehensive metabolic panel  ? Lipid panel  ?  ? Musculoskeletal and Integument  ? Skin lesion  ?  Lesion to the left forearm has been present for approximately 6 months or more that is nonhealing.  Ambulatory referral to dermatology. ? ?  ?  ? Relevant Orders  ? Ambulatory referral to Dermatology  ?  ? Other  ? Pure hypercholesterolemia  ?  Currently maintained on low-dose lovastatin 40 mg.  Patient does not exercise regularly.  Has been working on dietary modifications.  Pending lab results today ? ?  ?  ? Relevant Orders  ? Lipid panel  ? ? ?Return in about 14 weeks (around 07/30/2022) for for DM recheck .  ? ? ?[name], NP ? ?

## 2022-04-23 NOTE — Assessment & Plan Note (Signed)
Currently maintained on low-dose lovastatin 40 mg.  Patient does not exercise regularly.  Has been working on dietary modifications.  Pending lab results today ?

## 2022-04-23 NOTE — Patient Instructions (Signed)
Nice to see you today ?I will be in touch with the lab results once I have them ?You can take 2 of the farxiga 5mg  tabs until you finish the bottle. I sent in an updated script for the Farxiga 10mg  tablets. ?Follow up with me in a little over 3 months ?

## 2022-05-03 ENCOUNTER — Encounter: Payer: Self-pay | Admitting: *Deleted

## 2022-05-06 ENCOUNTER — Other Ambulatory Visit (HOSPITAL_COMMUNITY): Payer: Self-pay

## 2022-06-01 ENCOUNTER — Other Ambulatory Visit (HOSPITAL_COMMUNITY): Payer: Self-pay

## 2022-06-25 ENCOUNTER — Other Ambulatory Visit (HOSPITAL_COMMUNITY): Payer: Self-pay

## 2022-07-13 ENCOUNTER — Other Ambulatory Visit (HOSPITAL_COMMUNITY): Payer: Self-pay

## 2022-08-09 ENCOUNTER — Ambulatory Visit (INDEPENDENT_AMBULATORY_CARE_PROVIDER_SITE_OTHER)
Admission: RE | Admit: 2022-08-09 | Discharge: 2022-08-09 | Disposition: A | Discharge status: Home / Self Care | Payer: No Typology Code available for payment source | Source: Ambulatory Visit | Attending: Nurse Practitioner | Admitting: Nurse Practitioner

## 2022-08-09 ENCOUNTER — Other Ambulatory Visit (HOSPITAL_COMMUNITY): Payer: Self-pay

## 2022-08-09 ENCOUNTER — Encounter: Payer: Self-pay | Admitting: Nurse Practitioner

## 2022-08-09 ENCOUNTER — Ambulatory Visit (INDEPENDENT_AMBULATORY_CARE_PROVIDER_SITE_OTHER): Payer: No Typology Code available for payment source | Admitting: Nurse Practitioner

## 2022-08-09 VITALS — BP 128/84 | HR 80 | Temp 97.2°F | Resp 14 | Ht 70.25 in | Wt 219.0 lb

## 2022-08-09 DIAGNOSIS — M545 Low back pain, unspecified: Secondary | ICD-10-CM | POA: Diagnosis not present

## 2022-08-09 DIAGNOSIS — E119 Type 2 diabetes mellitus without complications: Secondary | ICD-10-CM

## 2022-08-09 DIAGNOSIS — I10 Essential (primary) hypertension: Secondary | ICD-10-CM | POA: Diagnosis not present

## 2022-08-09 LAB — COMPREHENSIVE METABOLIC PANEL
ALT: 23 U/L (ref 0–53)
AST: 22 U/L (ref 0–37)
Albumin: 4.3 g/dL (ref 3.5–5.2)
Alkaline Phosphatase: 78 U/L (ref 39–117)
BUN: 14 mg/dL (ref 6–23)
CO2: 29 mEq/L (ref 19–32)
Calcium: 9.6 mg/dL (ref 8.4–10.5)
Chloride: 103 mEq/L (ref 96–112)
Creatinine, Ser: 0.84 mg/dL (ref 0.40–1.50)
GFR: 99.73 mL/min (ref >60.00)
Glucose, Bld: 182 mg/dL — ABNORMAL HIGH (ref 70–99)
Potassium: 4.1 mEq/L (ref 3.5–5.1)
Sodium: 140 mEq/L (ref 135–145)
Total Bilirubin: 0.5 mg/dL (ref 0.2–1.2)
Total Protein: 7.1 g/dL (ref 6.0–8.3)

## 2022-08-09 LAB — POCT GLYCOSYLATED HEMOGLOBIN (HGB A1C): Hemoglobin A1C: 7.7 % — AB (ref 4.0–5.6)

## 2022-08-09 MED ORDER — CYCLOBENZAPRINE HCL 5 MG PO TABS
5.0000 mg | ORAL_TABLET | Freq: Two times a day (BID) | ORAL | 0 refills | Status: DC | PRN
  Filled 2022-08-09: qty 30, 15d supply, fill #0

## 2022-08-09 NOTE — Assessment & Plan Note (Signed)
Patient currently maintained on Cymbalta 20 mg.  Denies adverse drug events such as dry cough.  Blood pressure within normal limits today.  Continue taking medication as prescribed

## 2022-08-09 NOTE — Progress Notes (Signed)
Established Patient Office Visit  Subjective   Patient ID: Marvin Tapia, male    DOB: [birth year]  Age: 51 y.o. MRN: [id number]  Chief Complaint  Patient presents with   Diabetes    Readings have been 120-200 at home.      DM2: States that he is checking his sugar approx once a week. States that he has cut sugar drinks and powerade zero. Does not drink much water.  States that his son has been staying with him and has been making lots of food like pasta. Low: 120 High: 200  HTN: BP at home sometimes. Only when he has a headache. 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 States that this past Thursday. States movement makes it worse and getting up or down. 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  No weakness or numbness or tingling.  He felt the back crack      Review of Systems  Constitutional:  Negative for chills and fever.  Respiratory:  Negative for shortness of breath.   Cardiovascular:  Negative for chest pain.  Gastrointestinal:  Negative for abdominal pain, diarrhea, nausea and vomiting.  Genitourinary:  Negative for dysuria and hematuria.  Musculoskeletal:  Positive for back pain.  Neurological:  Negative for tingling, weakness and headaches.      Objective:     BP 128/84   Pulse 80   Temp (!) 97.2 F (36.2 C)   Resp 14   Ht 5' 10.25" (1.784 m)   Wt 219 lb (99.3 kg)   SpO2 98%   BMI 31.20 kg/m  BP Readings from Last 3 Encounters:  08/09/22 128/84  04/23/22 110/84  01/25/22 132/86   Wt Readings from Last 3 Encounters:  08/09/22 219 lb (99.3 kg)  04/23/22 221 lb 8 oz (100.5 kg)  01/25/22 224 lb 6 oz (101.8 kg)      Physical Exam Vitals and nursing note reviewed.  Constitutional:      Appearance: Normal appearance. He is obese.  Cardiovascular:     Rate and Rhythm: Normal rate and regular rhythm.     Pulses: Normal pulses.           Dorsalis pedis pulses are 2+ on the right side and 2+ on the left side.       Posterior tibial pulses are 2+ on the right side and 2+ on the left side.     Heart sounds: Normal heart sounds.  Pulmonary:     Effort: Pulmonary effort is normal.     Breath sounds: Normal breath sounds.  Musculoskeletal:     Lumbar back: Tenderness and bony tenderness present. Negative right straight leg raise test and negative left straight leg raise test.       Back:     Right lower leg: No edema.     Left lower leg: No edema.  Feet:     Right foot:     Toenail Condition: Right toenails are long.     Left foot:     Toenail Condition: Left toenails are long.  Neurological:     General: No focal deficit present.     Mental Status: He is alert.     Deep Tendon Reflexes:     Reflex Scores:      Patellar reflexes are 2+ on the right side and 2+ on the left side.  Comments: Bilateral lower extremity strength 5/5      Results for orders placed or performed in visit on 08/09/22  POCT glycosylated hemoglobin (Hb A1C)  Result Value Ref Range   Hemoglobin A1C 7.7 (A) 4.0 - 5.6 %   HbA1c POC (<> result, manual entry)     HbA1c, POC (prediabetic range)     HbA1c, POC (controlled diabetic range)        The 10-year ASCVD risk score (Arnett DK, et al., 2019) is: 11%    Assessment & Plan:   Problem List Items Addressed This Visit       Cardiovascular and Mediastinum   Essential hypertension    Patient currently maintained on Cymbalta 20 mg.  Denies adverse drug events such as dry cough.  Blood pressure within normal limits today.  Continue taking medication as prescribed        Endocrine   Type 2 diabetes mellitus without complication, without long-term current use of insulin (HCC) - Primary    Patient currently maintained on metformin 2000 mg daily along with Farxiga 10 mg daily.  Patient A1c trended from  8 6-> 7.4-> 7.7  today in office.  Patient is also lost weight since January of  this year.  Do think is more dietary than anything else.  Did discuss this with patient.  We did agreed to leave her on current regimen him to try to work on his dietary modifications.  He did state that he likes to have Oreos and a glass of milk prior to bed to help him sleep better.  Likely can start him on glipizide 2.5 mg XL or glipizide 5 mg XL depending on next A1c if warranted.      Relevant Orders   POCT glycosylated hemoglobin (Hb A1C) (Completed)   Comprehensive metabolic panel     Other   Lumbar pain    Acute in nature.  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  Will obtain lumbar x-ray and start patient on Flexeril 5 mg twice daily as needed.  Sedation cautions reviewed.  Signs and symptoms reviewed when to seek urgent emergent healthcare.      Relevant Medications   cyclobenzaprine (FLEXERIL) 5 MG tablet   Other Relevant Orders   DG Lumbar Spine Complete    Return in about 4 months (around 12/09/2022) for DM recheck.    [name], NP

## 2022-08-09 NOTE — Assessment & Plan Note (Addendum)
Acute in nature.  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  Will obtain lumbar x-ray and start patient on Flexeril 5 mg twice daily as needed.  Sedation cautions reviewed.  Signs and symptoms reviewed when to seek urgent emergent healthcare.

## 2022-08-09 NOTE — Patient Instructions (Signed)
Nice to see you today Work on diet and we will recheck the labs in 4 months Follow up in 4 months with me, sooner if you need me

## 2022-08-09 NOTE — Assessment & Plan Note (Signed)
Patient currently maintained on metformin 2000 mg daily along with Farxiga 10 mg daily.  Patient A1c trended from  8 6-> 7.4-> 7.7  today in office.  Patient is also lost weight since January of this year.  Do think is more dietary than anything else.  Did discuss this with patient.  We did agreed to leave her on current regimen him to try to work on his dietary modifications.  He did state that he likes to have Oreos and a glass of milk prior to bed to help him sleep better.  Likely can start him on glipizide 2.5 mg XL or glipizide 5 mg XL depending on next A1c if warranted.

## 2022-08-18 ENCOUNTER — Other Ambulatory Visit: Payer: Self-pay | Admitting: Nurse Practitioner

## 2022-08-19 ENCOUNTER — Other Ambulatory Visit (HOSPITAL_COMMUNITY): Payer: Self-pay

## 2022-08-19 MED ORDER — LOVASTATIN 40 MG PO TABS
40.0000 mg | ORAL_TABLET | Freq: Every day | ORAL | 1 refills | Status: DC
  Filled 2022-08-19: qty 90, 90d supply, fill #0

## 2022-10-04 ENCOUNTER — Other Ambulatory Visit: Payer: Self-pay | Admitting: Nurse Practitioner

## 2022-10-04 ENCOUNTER — Other Ambulatory Visit (HOSPITAL_COMMUNITY): Payer: Self-pay

## 2022-10-04 MED ORDER — PANTOPRAZOLE SODIUM 40 MG PO TBEC
40.0000 mg | DELAYED_RELEASE_TABLET | Freq: Every day | ORAL | 0 refills | Status: DC
  Filled 2022-10-04: qty 90, 90d supply, fill #0

## 2022-10-04 MED ORDER — LISINOPRIL 20 MG PO TABS
20.0000 mg | ORAL_TABLET | Freq: Every day | ORAL | 1 refills | Status: DC
  Filled 2022-10-04: qty 90, 90d supply, fill #0
  Filled 2023-01-04: qty 90, 90d supply, fill #1

## 2022-10-15 ENCOUNTER — Encounter (HOSPITAL_COMMUNITY): Payer: Self-pay | Admitting: Emergency Medicine

## 2022-10-15 ENCOUNTER — Emergency Department (HOSPITAL_COMMUNITY)
Admission: EM | Admit: 2022-10-15 | Discharge: 2022-10-16 | Discharge status: Against Medical Advice | Payer: No Typology Code available for payment source | Attending: Emergency Medicine | Admitting: Emergency Medicine

## 2022-10-15 ENCOUNTER — Other Ambulatory Visit: Payer: Self-pay

## 2022-10-15 DIAGNOSIS — R4781 Slurred speech: Secondary | ICD-10-CM | POA: Diagnosis not present

## 2022-10-15 DIAGNOSIS — E119 Type 2 diabetes mellitus without complications: Secondary | ICD-10-CM | POA: Diagnosis not present

## 2022-10-15 DIAGNOSIS — Z5321 Procedure and treatment not carried out due to patient leaving prior to being seen by health care provider: Secondary | ICD-10-CM | POA: Insufficient documentation

## 2022-10-15 DIAGNOSIS — Y900 Blood alcohol level of less than 20 mg/100 ml: Secondary | ICD-10-CM | POA: Insufficient documentation

## 2022-10-15 DIAGNOSIS — R0789 Other chest pain: Secondary | ICD-10-CM | POA: Insufficient documentation

## 2022-10-15 DIAGNOSIS — R200 Anesthesia of skin: Secondary | ICD-10-CM | POA: Insufficient documentation

## 2022-10-15 LAB — APTT: aPTT: 29 seconds (ref 24–36)

## 2022-10-15 LAB — CBC
HCT: 44.9 % (ref 39.0–52.0)
Hemoglobin: 15.9 g/dL (ref 13.0–17.0)
MCH: 30.8 pg (ref 26.0–34.0)
MCHC: 35.4 g/dL (ref 30.0–36.0)
MCV: 86.8 fL (ref 80.0–100.0)
Platelets: 243 10*3/uL (ref 150–400)
RBC: 5.17 MIL/uL (ref 4.22–5.81)
RDW: 13 % (ref 11.5–15.5)
WBC: 12.5 10*3/uL — ABNORMAL HIGH (ref 4.0–10.5)
nRBC: 0 % (ref 0.0–0.2)

## 2022-10-15 LAB — COMPREHENSIVE METABOLIC PANEL
ALT: 25 U/L (ref 0–44)
AST: 28 U/L (ref 15–41)
Albumin: 4.1 g/dL (ref 3.5–5.0)
Alkaline Phosphatase: 71 U/L (ref 38–126)
Anion gap: 9 (ref 5–15)
BUN: 14 mg/dL (ref 6–20)
CO2: 26 mmol/L (ref 22–32)
Calcium: 9.6 mg/dL (ref 8.9–10.3)
Chloride: 105 mmol/L (ref 98–111)
Creatinine, Ser: 0.97 mg/dL (ref 0.61–1.24)
GFR, Estimated: >60 mL/min (ref >60)
Glucose, Bld: 203 mg/dL — ABNORMAL HIGH (ref 70–99)
Potassium: 3.8 mmol/L (ref 3.5–5.1)
Sodium: 140 mmol/L (ref 135–145)
Total Bilirubin: 0.6 mg/dL (ref 0.3–1.2)
Total Protein: 6.9 g/dL (ref 6.5–8.1)

## 2022-10-15 LAB — I-STAT CHEM 8, ED
BUN: 18 mg/dL (ref 6–20)
Calcium, Ion: 1.18 mmol/L (ref 1.15–1.40)
Chloride: 102 mmol/L (ref 98–111)
Creatinine, Ser: 0.9 mg/dL (ref 0.61–1.24)
Glucose, Bld: 202 mg/dL — ABNORMAL HIGH (ref 70–99)
HCT: 44 % (ref 39.0–52.0)
Hemoglobin: 15 g/dL (ref 13.0–17.0)
Potassium: 3.8 mmol/L (ref 3.5–5.1)
Sodium: 141 mmol/L (ref 135–145)
TCO2: 24 mmol/L (ref 22–32)

## 2022-10-15 LAB — DIFFERENTIAL
Abs Immature Granulocytes: 0.04 10*3/uL (ref 0.00–0.07)
Basophils Absolute: 0 10*3/uL (ref 0.0–0.1)
Basophils Relative: 0 %
Eosinophils Absolute: 0 10*3/uL (ref 0.0–0.5)
Eosinophils Relative: 0 %
Immature Granulocytes: 0 %
Lymphocytes Relative: 34 %
Lymphs Abs: 4.3 10*3/uL — ABNORMAL HIGH (ref 0.7–4.0)
Monocytes Absolute: 0.9 10*3/uL (ref 0.1–1.0)
Monocytes Relative: 7 %
Neutro Abs: 7.2 10*3/uL (ref 1.7–7.7)
Neutrophils Relative %: 59 %

## 2022-10-15 LAB — URINALYSIS, ROUTINE W REFLEX MICROSCOPIC
Bacteria, UA: NONE SEEN
Bilirubin Urine: NEGATIVE
Glucose, UA: >=500 mg/dL — AB
Hgb urine dipstick: NEGATIVE
Ketones, ur: NEGATIVE mg/dL
Leukocytes,Ua: NEGATIVE
Nitrite: NEGATIVE
Protein, ur: NEGATIVE mg/dL
Specific Gravity, Urine: 1.037 — ABNORMAL HIGH (ref 1.005–1.030)
pH: 5 (ref 5.0–8.0)

## 2022-10-15 LAB — RAPID URINE DRUG SCREEN, HOSP PERFORMED
Amphetamines: NOT DETECTED
Barbiturates: NOT DETECTED
Benzodiazepines: NOT DETECTED
Cocaine: NOT DETECTED
Opiates: NOT DETECTED
Tetrahydrocannabinol: NOT DETECTED

## 2022-10-15 LAB — PROTIME-INR
INR: 1.1 (ref 0.8–1.2)
Prothrombin Time: 13.9 seconds (ref 11.4–15.2)

## 2022-10-15 LAB — ETHANOL: Alcohol, Ethyl (B): <10 mg/dL (ref <10)

## 2022-10-15 MED ORDER — BICTEGRAVIR-EMTRICITAB-TENOFOV 50-200-25 MG PO TABS: 1.0000 | ORAL_TABLET | Freq: Every day | ORAL | Status: DC

## 2022-10-15 NOTE — ED Triage Notes (Signed)
Pt c/o right arm numbness that started at 3pm today, also reports some chest tightness as well. Hx diabetes, htn, no other neuro deficits noted.

## 2022-10-15 NOTE — ED Provider Triage Note (Signed)
Emergency Medicine Provider Triage Evaluation Note  Marvin Tapia , a 51 y.o. male  was evaluated in triage.  Pt complains of right arm numbness.  Patient reports that earlier today he was at work when all of a sudden he had sudden onset of right arm numbness and decreased grip strength.  The patient also was endorsing slight slurred speech.  Patient reports that this occurred around 3 PM.  The patient is also endorsing slightly decreased strength in his right lower extremity.  The patient does have slightly slurred speech on examination along with decreased grip strength to right upper and lower extremities.    Review of Systems  Positive:  Negative:   Physical Exam  BP 111/81   Pulse 83   Temp 98.6 F (37 C) (Oral)   Resp 18   SpO2 95%  Gen:   Awake, no distress   Resp:  Normal effort  MSK:   Moves extremities without difficulty  Other:    Medical Decision Making  Medically screening exam initiated at 9:08 PM.  Appropriate orders placed.  Marvin Tapia was informed that the remainder of the evaluation will be completed by another provider, this initial triage assessment does not replace that evaluation, and the importance of remaining in the ED until their evaluation is complete.     Azucena Cecil, PA-C 10/15/22 [id number]

## 2022-10-16 ENCOUNTER — Emergency Department (HOSPITAL_COMMUNITY): Payer: No Typology Code available for payment source

## 2022-10-16 NOTE — ED Notes (Signed)
PATIENT LEFT AMA 

## 2022-10-17 ENCOUNTER — Encounter: Payer: Self-pay | Admitting: Nurse Practitioner

## 2022-10-18 ENCOUNTER — Ambulatory Visit (INDEPENDENT_AMBULATORY_CARE_PROVIDER_SITE_OTHER): Payer: No Typology Code available for payment source | Admitting: Family Medicine

## 2022-10-18 ENCOUNTER — Encounter: Payer: Self-pay | Admitting: Family Medicine

## 2022-10-18 DIAGNOSIS — R29898 Other symptoms and signs involving the musculoskeletal system: Secondary | ICD-10-CM | POA: Diagnosis not present

## 2022-10-18 NOTE — Telephone Encounter (Signed)
Spoke with patient and Romilda Garret about this patient. Patient is having decreased strength in his right hand and numbness down his right arm. No other symptoms. No chest pain or tightness. Had a little bit of confusion on Friday per patient's son but wonders if it was from been anxious about his symptoms. Patient went to ER on Friday but stated they told him it could be another 17 hours before he sees a provider. No acute distress currently aside from hand symptoms. Per PCP ok to see another provider today since PCP has no openings. Scheduled with Dr Glori Bickers at 4 pm advised patient if earlier appointment opens up I would give him a call to reschedule

## 2022-10-18 NOTE — Assessment & Plan Note (Addendum)
Since 10/20 , seen in ER but left AMA (and was already over 4 hours out and out of window for clot tx)  CT head nl  EKG and lab work fairly reassuring as well Reviewed hospital records, lab results and studies in detail   Today weakness is focal in RUE and improved but not resolved and no speech slurring or other neuro findings inst to inc asa to 325 mg daily  MRI brain ordered (cannot get earlier than tomorrow at noon and he declines ER)  Careful conversation about ER parameters - inst to call 911 if symptoms worsen or new symptoms (wife is watching) If this is a stroke will need to f/u with pcp to disc more aggressive cholesterol and blood sugar control and further vascular work up

## 2022-10-18 NOTE — Progress Notes (Signed)
Subjective:    Patient ID: Marvin Tapia, male    DOB: January 02, 1971, 51 y.o.   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 in R hand   Wt Readings from Last 3 Encounters:  10/18/22 214 lb 6 oz (97.2 kg)  08/09/22 219 lb (99.3 kg)  04/23/22 221 lb 8 oz (100.5 kg)   30.54 kg/m  He went to the ER on 10/20 Left AMA  Presented with R arm numbness Sudden onset of R arm numbness and decreased grip strength that day 3 pm (hanging clothes)  Slight slurred speech and slight dec strength RLE  H/o HTN Also DM2  BP Readings from Last 3 Encounters:  10/18/22 112/74  10/16/22 112/80  08/09/22 128/84   Pulse Readings from Last 3 Encounters:  10/18/22 85  10/16/22 76  08/09/22 80   Lab Results  Component Value Date   HGBA1C 7.7 (A) 08/09/2022   Lab Results  Component Value Date   CHOL 189 04/23/2022   HDL 42.90 04/23/2022   LDLCALC 111 (H) 04/23/2022   LDLDIRECT 123.0 08/14/2021   TRIG 175.0 (H) 04/23/2022   CHOLHDL 4 04/23/2022   CT HEAD WO CONTRAST  Result Date: 10/16/2022 CLINICAL DATA:  Neuro deficit acute stroke suspected EXAM: CT HEAD WITHOUT CONTRAST TECHNIQUE: Contiguous axial images were obtained from the base of the skull through the vertex without intravenous contrast. RADIATION DOSE REDUCTION: This exam was performed according to the departmental dose-optimization program which includes automated exposure control, adjustment of the mA and/or kV according to patient size and/or use of iterative reconstruction technique. COMPARISON:  None Available. FINDINGS: Brain: No intracranial hemorrhage, mass effect, or evidence of acute infarct. No hydrocephalus. No extra-axial fluid collection. Vascular: No hyperdense vessel or unexpected calcification. Skull: No fracture or focal lesion. Sinuses/Orbits: No acute finding. Paranasal sinuses and mastoid air cells are well aerated. Other: None. IMPRESSION: No acute intracranial abnormality.  Electronically Signed   By: Placido Sou M.D.   On: 10/16/2022 01:32    Lab Results  Component Value Date   CREATININE 0.90 10/15/2022   BUN 18 10/15/2022   NA 141 10/15/2022   K 3.8 10/15/2022   CL 102 10/15/2022   CO2 26 10/15/2022   Lab Results  Component Value Date   ALT 25 10/15/2022   AST 28 10/15/2022   ALKPHOS 71 10/15/2022   BILITOT 0.6 10/15/2022   Lab Results  Component Value Date   TSH 2.16 08/14/2021   Lab Results  Component Value Date   WBC 12.5 (H) 10/15/2022   HGB 15.0 10/15/2022   HCT 44.0 10/15/2022   MCV 86.8 10/15/2022   PLT 243 10/15/2022   Urine tox screen neg   EKG NSR with rate of 87     Now  Feels pretty good  Strength in arm and hand is improved but still not normal   Speech is normal  No facial droop Leg is perfectly fine  No confused   Little tension headache now and then (felt like brain freeze briefly) None now   Taking 81 asa daily- were not taking regularly but now he is   No neuro problems  Older brother had a stroke   Very physical job No extra exercise  Works too much   Moderate stress- job and financial  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   Patient Active Problem List   Diagnosis Date Noted   Weakness of  right hand 10/18/2022   Lumbar pain 08/09/2022   Skin lesion 04/23/2022   Right foot pain 01/25/2022   Colonoscopy refused 08/14/2021   Suture granuloma    Essential hypertension 05/15/2020   Pure hypercholesterolemia 05/15/2020   Type 2 diabetes mellitus without complication, without long-term current use of insulin (HCC) 05/15/2020   GERD (gastroesophageal reflux disease) 05/15/2020   Past Medical History:  Diagnosis Date   Diabetes mellitus without complication (HCC)    GERD (gastroesophageal reflux disease)    Hyperlipidemia    Hypertension    Past Surgical History:  Procedure Laterality Date   HERNIA REPAIR     TONSILLECTOMY     WOUND EXPLORATION  10/01/2020    Procedure: ABDOMINAL WOUND EXPLORATION;  Surgeon: [name], MD;  Location: [facility];  Service: General;;   Social History   Tobacco Use   Smoking status: Former    Packs/day: 1.00    Years: 20.00    Total pack years: 20.00    Types: Cigarettes    Quit date: 06/26/2021    Years since quitting: 1.3   Smokeless tobacco: Never  Substance Use Topics   Alcohol use: Not Currently    Alcohol/week: 0.0 standard drinks of alcohol   Drug use: Never   Family History  Problem Relation Age of Onset   Hyperlipidemia Father    Heart disease Father    Diabetes Brother    Hypertension Brother    Allergies  Allergen Reactions   Sulfa Antibiotics     Unknown   Current Outpatient Medications on File Prior to Visit  Medication Sig Dispense Refill   aspirin 81 MG EC tablet      dapagliflozin propanediol (FARXIGA) 10 MG TABS tablet Take 1 tablet by mouth daily before breakfast. 90 tablet 1   glucose blood (FREESTYLE LITE) test strip Use as directed in the morning and at bedtime. 200 each 12   lisinopril (ZESTRIL) 20 MG tablet Take 1 tablet by mouth daily. 90 tablet 1   lovastatin (MEVACOR) 40 MG tablet Take 1 tablet by mouth at bedtime. 90 tablet 1   metFORMIN (GLUCOPHAGE) 1000 MG tablet TAKE 1 TABLET BY MOUTH TWICE DAILY 180 tablet 3   pantoprazole (PROTONIX) 40 MG tablet Take 1 tablet (40 mg total) by mouth daily. 90 tablet 0   No current facility-administered medications on file prior to visit.     Review of Systems  Constitutional:  Positive for fatigue. Negative for activity change, appetite change, fever and unexpected weight change.  HENT:  Negative for congestion, rhinorrhea, sore throat and trouble swallowing.   Eyes:  Negative for pain, redness, itching and visual disturbance.  Respiratory:  Negative for cough, chest tightness, shortness of breath and wheezing.   Cardiovascular:  Negative for chest pain and palpitations.  Gastrointestinal:  Negative for abdominal pain, blood in  stool, constipation, diarrhea and nausea.  Endocrine: Negative for cold intolerance, heat intolerance, polydipsia and polyuria.  Genitourinary:  Negative for difficulty urinating, dysuria, frequency and urgency.  Musculoskeletal:  Negative for arthralgias, joint swelling and myalgias.  Skin:  Negative for pallor and rash.  Neurological:  Positive for weakness and numbness. Negative for dizziness, tremors, seizures, syncope, facial asymmetry, speech difficulty, light-headedness and headaches.  Hematological:  Negative for adenopathy. Does not bruise/bleed easily.  Psychiatric/Behavioral:  Negative for decreased concentration and dysphoric mood. The patient is not nervous/anxious.        Objective:   Physical Exam Constitutional:      General: He is not  in acute distress.    Appearance: Normal appearance. He is well-developed. He is obese. He is not ill-appearing or diaphoretic.  HENT:     Head: Normocephalic and atraumatic.     Mouth/Throat:     Mouth: Mucous membranes are moist.  Eyes:     General: No visual field deficit or scleral icterus.    Conjunctiva/sclera: Conjunctivae normal.     Pupils: Pupils are equal, round, and reactive to light.  Neck:     Thyroid: No thyromegaly.     Vascular: No carotid bruit or JVD.  Cardiovascular:     Rate and Rhythm: Normal rate and regular rhythm.     Heart sounds: Normal heart sounds. No murmur heard.    No gallop.  Pulmonary:     Effort: Pulmonary effort is normal. No respiratory distress.     Breath sounds: Normal breath sounds. No wheezing or rales.  Abdominal:     General: There is no distension or abdominal bruit.     Palpations: Abdomen is soft. There is no mass.     Tenderness: There is no abdominal tenderness. There is no guarding or rebound.  Musculoskeletal:     Cervical back: Normal range of motion and neck supple. No tenderness.     Right lower leg: No edema.     Left lower leg: No edema.  Lymphadenopathy:     Cervical: No  cervical adenopathy.  Skin:    General: Skin is warm and dry.     Coloration: Skin is not pale.     Findings: No rash.  Neurological:     Mental Status: He is alert and oriented to person, place, and time.     Cranial Nerves: Cranial nerves 2-12 are intact. No cranial nerve deficit, dysarthria or facial asymmetry.     Sensory: Sensation is intact. No sensory deficit.     Motor: Weakness present. No tremor, atrophy, abnormal muscle tone, seizure activity or pronator drift.     Coordination: Coordination is intact. Romberg sign negative. Coordination normal. Finger-Nose-Finger Test normal.     Gait: Gait and tandem walk normal.     Deep Tendon Reflexes: Reflexes are normal and symmetric. Reflexes normal.     Comments: 3/5 strength in R hand /fingers/arm  Some difficulty touching thumb to each finger of R hand (per pt this is much improved from where it was)   No slurred speech     Psychiatric:        Mood and Affect: Mood normal.           Assessment & Plan:   Problem List Items Addressed This Visit       Other   Weakness of right hand    Since 10/20 , seen in ER but left AMA (and was already over 4 hours out and out of window for clot tx)  CT head nl  EKG and lab work fairly reassuring as well Reviewed hospital records, lab results and studies in detail   Today weakness is focal in RUE and improved but not resolved and no speech slurring or other neuro findings inst to inc asa to 325 mg daily  MRI brain ordered (cannot get earlier than tomorrow at noon and he declines ER)  Careful conversation about ER parameters - inst to call 911 if symptoms worsen or new symptoms (wife is watching) If this is a stroke will need to f/u with pcp to disc more aggressive cholesterol and blood sugar control and further vascular work  up        Relevant Orders   MR BRAIN W WO CONTRAST

## 2022-10-18 NOTE — Patient Instructions (Addendum)
If your symptoms worsen please go to the ER   I want to work on getting an MRI of your brain to looks for signs of a stroke   [facility]   [facility] ([place] - check in at Radiology)  arrive by 11:30am to check in  **NO restrictions**   Can eat and drink as normal    Continue the aspirin but go up to full dose 325 mg daily  Take it daily with food   If symptoms worsen at all or if any new neurologic symptoms call 911 !!!

## 2022-10-19 ENCOUNTER — Other Ambulatory Visit (HOSPITAL_COMMUNITY): Payer: Self-pay

## 2022-10-19 ENCOUNTER — Telehealth: Payer: Self-pay | Admitting: Family Medicine

## 2022-10-19 ENCOUNTER — Ambulatory Visit
Admission: RE | Admit: 2022-10-19 | Discharge: 2022-10-19 | Disposition: A | Discharge status: Home / Self Care | Payer: No Typology Code available for payment source | Source: Ambulatory Visit | Attending: Family Medicine | Admitting: Family Medicine

## 2022-10-19 DIAGNOSIS — R29898 Other symptoms and signs involving the musculoskeletal system: Secondary | ICD-10-CM | POA: Insufficient documentation

## 2022-10-19 MED ORDER — GADOBUTROL 1 MMOL/ML IV SOLN
9.0000 mL | Freq: Once | INTRAVENOUS | Status: AC | PRN
  Administered 2022-10-19: 9 mL via INTRAVENOUS

## 2022-10-19 MED ORDER — ROSUVASTATIN CALCIUM 10 MG PO TABS
10.0000 mg | ORAL_TABLET | Freq: Every day | ORAL | 3 refills | Status: DC
  Filled 2022-10-19: qty 90, 90d supply, fill #0

## 2022-10-19 NOTE — Telephone Encounter (Signed)
Appt scheduled tomorrow at 9am with Catalina Antigua, NP per pt request. Pt said he can go to either city for referrals ([place] or [place]), he just said whoever can get him in the quickest is where he wants to go.  FYI to PCP

## 2022-10-19 NOTE — Telephone Encounter (Signed)
Called pt with MRI report confirming stroke as cause of R hand symptoms   Report: IMPRESSION: 1. Small early subacute infarcts in the left frontal lobe involving the pre and postcentral gyri and middle frontal gyrus. 2. Minimal underlying chronic small vessel ischemic change.  Pt is doing better and continues to improve with hand strength   D/w his pcp   Will change from lovastatin to crestor 10 mg to get LDL to 70 or below (may need to inc to 20 if not at goal)   Would consider  Carotid dopplers, 2 D echo and PT/OT for R hand  Neuro ref  Asa 325 mg daily  Good bp and chol and glucose control   ER precautions rev in detail (911 if worse or new neuro symptoms) He voiced understanding  [place]-please schedule f/u with [facility]

## 2022-10-20 ENCOUNTER — Ambulatory Visit (INDEPENDENT_AMBULATORY_CARE_PROVIDER_SITE_OTHER): Payer: No Typology Code available for payment source | Admitting: Nurse Practitioner

## 2022-10-20 ENCOUNTER — Other Ambulatory Visit (HOSPITAL_COMMUNITY): Payer: Self-pay

## 2022-10-20 ENCOUNTER — Ambulatory Visit (HOSPITAL_COMMUNITY)
Admission: RE | Admit: 2022-10-20 | Discharge: 2022-10-20 | Disposition: A | Discharge status: Home / Self Care | Payer: No Typology Code available for payment source | Source: Ambulatory Visit | Attending: Nurse Practitioner | Admitting: Nurse Practitioner

## 2022-10-20 ENCOUNTER — Encounter: Payer: Self-pay | Admitting: Nurse Practitioner

## 2022-10-20 VITALS — BP 118/76 | HR 96 | Temp 97.6°F | Resp 12 | Ht 70.25 in | Wt 212.4 lb

## 2022-10-20 DIAGNOSIS — I6389 Other cerebral infarction: Secondary | ICD-10-CM

## 2022-10-20 DIAGNOSIS — I10 Essential (primary) hypertension: Secondary | ICD-10-CM

## 2022-10-20 DIAGNOSIS — R29898 Other symptoms and signs involving the musculoskeletal system: Secondary | ICD-10-CM

## 2022-10-20 DIAGNOSIS — R202 Paresthesia of skin: Secondary | ICD-10-CM | POA: Diagnosis not present

## 2022-10-20 DIAGNOSIS — E119 Type 2 diabetes mellitus without complications: Secondary | ICD-10-CM

## 2022-10-20 DIAGNOSIS — I69351 Hemiplegia and hemiparesis following cerebral infarction affecting right dominant side: Secondary | ICD-10-CM | POA: Diagnosis not present

## 2022-10-20 DIAGNOSIS — I639 Cerebral infarction, unspecified: Secondary | ICD-10-CM | POA: Insufficient documentation

## 2022-10-20 NOTE — Progress Notes (Signed)
Established Patient Office Visit  Subjective   Patient ID: Marvin Tapia, male    DOB: [birth year]  Age: 51 y.o. MRN: [id number]  Chief Complaint  Patient presents with   follow up on MRI results/stroke    Still has some weakness in right hand and tingling/numbness sensation off and on in the right arm. No other symptoms. No new symptoms.    HPI   MRI follow up: States that he was at the ED on 10/15/2022, left due to wait times. They did do a CT scan and blood work.  Patient CT showed no acute abnormality.  He did message the clinic on Monday, 10/17/2022 and I recommended patient be seen in clinic.  He was evaluated by colleague on 10/18/2022.  High suspicion for stroke MRI of brain with and without contrast was done and did appreciate to subacute left frontal strokes.  States that he was hanging clothes on a rack and was almost done when he could not use the right arm. States that was on firday. He went to ED on 10/15/2022.  Patient has having waxing and waning sensation abnormalities to the right arm and axilla.  Patient is still having weakness in regards of hand strength on the right side along with fine motor skill disturbances.   Review of Systems  Constitutional:  Negative for chills and fever.  Respiratory:  Negative for shortness of breath.   Cardiovascular:  Negative for chest pain.  Neurological:  Positive for tingling, focal weakness and headaches (brain freeze sensatoins since). Negative for dizziness.      Objective:     BP 118/76   Pulse 96   Temp 97.6 F (36.4 C) (Temporal)   Resp 12   Ht 5' 10.25" (1.784 m)   Wt 212 lb 6 oz (96.3 kg)   SpO2 100%   BMI 30.26 kg/m    Physical Exam Vitals and nursing note reviewed.  Constitutional:      Appearance: Normal appearance.  HENT:     Mouth/Throat:     Mouth: Mucous membranes are moist.     Pharynx: Oropharynx is clear.  Eyes:     Extraocular Movements: Extraocular movements intact.     Pupils: Pupils  are equal, round, and reactive to light.  Neck:     Vascular: No carotid bruit.  Cardiovascular:     Rate and Rhythm: Normal rate and regular rhythm.     Heart sounds: Normal heart sounds.  Pulmonary:     Effort: Pulmonary effort is normal.     Breath sounds: Normal breath sounds.  Musculoskeletal:     Right lower leg: No edema.     Left lower leg: No edema.  Neurological:     Mental Status: He is alert.     Cranial Nerves: Cranial nerves 2-12 are intact.     Motor: Weakness present.     Coordination: Finger-Nose-Finger Test normal.     Gait: Gait is intact.     Deep Tendon Reflexes:     Reflex Scores:      Bicep reflexes are 2+ on the right side and 2+ on the left side.      Patellar reflexes are 2+ on the right side and 2+ on the left side.    Comments: Bilateral upper and lower gross extremity strength 5/5  3/5 right hand grip strength  5/5 left hand strength  Decreased fine motor of right hand       No results found for any visits  on 10/20/22.    The ASCVD Risk score (Arnett DK, et al., 2019) failed to calculate for the following reasons:   The patient has a prior MI or stroke diagnosis    Assessment & Plan:   Problem List Items Addressed This Visit       Cardiovascular and Mediastinum   Essential hypertension    Blood pressure well controlled on medication.  Continue taking lisinopril as prescribed.      Relevant Medications   aspirin 325 MG tablet   Cerebrovascular accident [facility]) - Primary    To frontal lobe left-sided strokes visualized on MRI brain performed on 10/19/2022.  Patient still experiencing some right-sided deficits.  Did review signs and symptoms of recurrent stroke and to be seen immediately in the emergency department via 911.  We will set patient up with carotid ultrasound, echocardiogram, ambulatory referral to neurology, ambulatory referral to physical therapy, and ambulatory referral to patient to therapy.  My colleague already changed  patient to a higher intensity statin from lovastatin to Crestor.      Relevant Medications   aspirin 325 MG tablet   Other Relevant Orders   ECHOCARDIOGRAM COMPLETE   Ambulatory referral to Occupational Therapy   Ambulatory referral to Physical Therapy   VAS US CAROTID   Ambulatory referral to Neurology     Endocrine   Type 2 diabetes mellitus without complication, without long-term current use of insulin ([place])    Patient's last A1c was 7.7.  Patient currently maintained on Farxiga and metformin.  Patient suboptimal control.  Given that he has had a CVA we will need to drive G1W under 7.0 would like to be under 6.5 if patient can tolerate.  Can think about adding on a GLP-1 to help with his diabetes and aid in some weight loss.  He will follow-up next month for A1c recheck      Relevant Medications   aspirin 325 MG tablet     Other   Weakness of right hand    Subacute stroke confirmed.  Ambulatory referral to PT and OT      Paresthesias    Intermittent resultant from subacute stroke.  Ambulatory referral to neurology along with physical therapy and Occupational Therapy.       Return in about 4 weeks (around 11/17/2022) for DM recheck/Stroke recheck .    Romilda Garret, NP

## 2022-10-20 NOTE — Assessment & Plan Note (Signed)
To frontal lobe left-sided strokes visualized on MRI brain performed on 10/19/2022.  Patient still experiencing some right-sided deficits.  Did review signs and symptoms of recurrent stroke and to be seen immediately in the emergency department via 911.  We will set patient up with carotid ultrasound, echocardiogram, ambulatory referral to neurology, ambulatory referral to physical therapy, and ambulatory referral to patient to therapy.  My colleague already changed patient to a higher intensity statin from lovastatin to Crestor.

## 2022-10-20 NOTE — Assessment & Plan Note (Signed)
Blood pressure well controlled on medication.  Continue taking lisinopril as prescribed.

## 2022-10-20 NOTE — Assessment & Plan Note (Signed)
Subacute stroke confirmed.  Ambulatory referral to PT and OT

## 2022-10-20 NOTE — Assessment & Plan Note (Signed)
Intermittent resultant from subacute stroke.  Ambulatory referral to neurology along with physical therapy and Occupational Therapy.

## 2022-10-20 NOTE — Assessment & Plan Note (Signed)
Patient's last A1c was 7.7.  Patient currently maintained on Farxiga and metformin.  Patient suboptimal control.  Given that he has had a CVA we will need to drive H7W under 7.0 would like to be under 6.5 if patient can tolerate.  Can think about adding on a GLP-1 to help with his diabetes and aid in some weight loss.  He will follow-up next month for A1c recheck

## 2022-10-20 NOTE — Patient Instructions (Signed)
Nice to see you today I want to see you in 1 month, sooner If you need me

## 2022-10-21 ENCOUNTER — Encounter: Payer: Self-pay | Admitting: Rehabilitation

## 2022-10-21 ENCOUNTER — Ambulatory Visit: Payer: No Typology Code available for payment source | Attending: Nurse Practitioner | Admitting: Rehabilitation

## 2022-10-21 ENCOUNTER — Other Ambulatory Visit: Payer: Self-pay

## 2022-10-21 DIAGNOSIS — I6389 Other cerebral infarction: Secondary | ICD-10-CM | POA: Diagnosis not present

## 2022-10-21 NOTE — Therapy (Signed)
OUTPATIENT PHYSICAL THERAPY NEURO EVALUATION   Patient Name: Marvin Tapia MRN: [id number] DOB:24-May-1971, 51 y.o., male Today's Date: 10/21/2022   PCP: Karl Ito, NP  REFERRING PROVIDER: Karl Ito, NP   PT End of Session - 10/21/22 1109     Visit Number 1    Number of Visits 1   eval only   Authorization Type [employer]    PT Start Time 1100    PT Stop Time 1140    PT Time Calculation (min) 40 min    Activity Tolerance Patient tolerated treatment well    Behavior During Therapy WFL for tasks assessed/performed             Past Medical History:  Diagnosis Date   Diabetes mellitus without complication ([place])    GERD (gastroesophageal reflux disease)    Hyperlipidemia    Hypertension    Past Surgical History:  Procedure Laterality Date   HERNIA REPAIR     TONSILLECTOMY     WOUND EXPLORATION  10/01/2020   Procedure: ABDOMINAL WOUND EXPLORATION;  Surgeon: Aviva Signs, MD;  Location: [facility];  Service: General;;   Patient Active Problem List   Diagnosis Date Noted   Cerebrovascular accident ([place]) 10/20/2022   Paresthesias 10/20/2022   Weakness of right hand 10/18/2022   Lumbar pain 08/09/2022   Skin lesion 04/23/2022   Right foot pain 01/25/2022   Colonoscopy refused 08/14/2021   Suture granuloma    Essential hypertension 05/15/2020   Pure hypercholesterolemia 05/15/2020   Type 2 diabetes mellitus without complication, without long-term current use of insulin ([place]) 05/15/2020   GERD (gastroesophageal reflux disease) 05/15/2020    ONSET DATE: 10/20/2022   REFERRING DIAG: F81.82 (ICD-10-CM) - Cerebrovascular accident (CVA) due to other mechanism ([place])   THERAPY DIAG:  No diagnosis found.  Rationale for Evaluation and Treatment Rehabilitation  SUBJECTIVE:                                                                                                                                                                                             SUBJECTIVE  STATEMENT: Pt reports only issue is R arm strength and continued numbness and tingling.  He did go to the ED but wait time was 8+hours so left.  Did get CT but then saw MD on Monday, got MRI and showed stroke.  Reports that numbness/tingling increases with increased activity.  Pt accompanied by: significant other  PERTINENT HISTORY: HTN, DM  PAIN:  Are you having pain? No  PRECAUTIONS: None  WEIGHT BEARING RESTRICTIONS: No  FALLS: Has patient fallen in last 6 months? No  LIVING ENVIRONMENT: Lives with:  lives with their family Lives in: House/apartment Stairs: Yes: External: none  steps; none Has following equipment at home: None  PLOF: Independent  PATIENT GOALS: "To get my arm/hand back to how it used to be."   OBJECTIVE:   DIAGNOSTIC FINDINGS: n/a   COGNITION: Overall cognitive status: Within functional limits for tasks assessed   SENSATION: Light touch: Impaired  R UE, From under axilla down to arm/hand (N/t) increases with increased activity  COORDINATION: WFL     POSTURE: No Significant postural limitations  LOWER EXTREMITY ROM:      Right Eval Left Eval  Hip flexion    Hip extension    Hip abduction    Hip adduction    Hip internal rotation    Hip external rotation    Knee flexion    Knee extension    Ankle dorsiflexion    Ankle plantarflexion    Ankle inversion    Ankle eversion     (Blank rows = not tested) WFL   LOWER EXTREMITY MMT:    MMT Right Eval Left Eval  Hip flexion    Hip extension    Hip abduction    Hip adduction    Hip internal rotation    Hip external rotation    Knee flexion    Knee extension    Ankle dorsiflexion    Ankle plantarflexion    Ankle inversion    Ankle eversion    (Blank rows = not tested) WFL     TRANSFERS: Assistive device utilized: None  Sit to stand: Complete Independence Stand to sit: Complete Independence Chair to chair: Complete Independence Floor:  n/a    STAIRS: Level of Assistance:  Complete Independence Stair Negotiation Technique: Alternating Pattern  with No Rails Number of Stairs: 4  Height of Stairs: 6  Comments: n/a  GAIT: Gait pattern: WFL Distance walked: 300' indoors, 500' outdoors  Assistive device utilized: None Level of assistance: Complete Independence Comments: n/a  FUNCTIONAL TESTS:  Functional gait assessment: 29/30 Gait speed [facility]   [facility] PT Assessment - 10/21/22 1122       Functional Gait  Assessment   Gait assessed  Yes    Gait Level Surface Walks 20 ft in less than 5.5 sec, no assistive devices, good speed, no evidence for imbalance, normal gait pattern, deviates no more than 6 in outside of the 12 in walkway width.    Change in Gait Speed Able to smoothly change walking speed without loss of balance or gait deviation. Deviate no more than 6 in outside of the 12 in walkway width.    Gait with Horizontal Head Turns Performs head turns smoothly with no change in gait. Deviates no more than 6 in outside 12 in walkway width    Gait with Vertical Head Turns Performs head turns with no change in gait. Deviates no more than 6 in outside 12 in walkway width.    Gait and Pivot Turn Pivot turns safely within 3 sec and stops quickly with no loss of balance.    Step Over Obstacle Is able to step over 2 stacked shoe boxes taped together (9 in total height) without changing gait speed. No evidence of imbalance.    Gait with Narrow Base of Support Is able to ambulate for 10 steps heel to toe with no staggering.    Gait with Eyes Closed Walks 20 ft, no assistive devices, good speed, no evidence of imbalance, normal gait pattern, deviates no more than 6 in outside 12  in walkway width. Ambulates 20 ft in less than 7 sec.    Ambulating Backwards Walks 20 ft, uses assistive device, slower speed, mild gait deviations, deviates 6-10 in outside 12 in walkway width.    Steps Alternating feet, no rail.    Total Score 29                TODAY'S TREATMENT:                                                                                   DATE:  N/a   PATIENT EDUCATION: Education details: Educated to work on walking for endurance/activity tolerance.  Educated on safety with sharp items/hot items due to sensory issues in RUE.  Person educated: Patient and Spouse Education method: Medical illustrator Education comprehension: verbalized understanding  HOME EXERCISE PROGRAM: N/a    ASSESSMENT:  CLINICAL IMPRESSION: Patient is a 51 y.o. male who was seen today for physical therapy evaluation and treatment for RUE weakness/sensory deficits from CVA.  Note history of HTN and DMII.  Was seen for PT evaluation today and did not demonstrate any balance or strength deficits in LEs.  He does have referral for OT and recommend he schedule this as these are his only deficits.    OBJECTIVE IMPAIRMENTS:  RUE sensory deficits and weakness .   ACTIVITY LIMITATIONS: lifting, dressing, and self feeding  PARTICIPATION LIMITATIONS: occupation  PERSONAL FACTORS: 1-2 comorbidities: see above  are also affecting patient's functional outcome.     PLAN:  PT FREQUENCY:  no follow up   PT DURATION: other: none       Harriet Butte, PT, MPT [facility] [address] [place], [place], [address] Phone: [phone]   Fax:  [phone] 10/21/22, 11:49 AM

## 2022-10-22 NOTE — Therapy (Incomplete)
OUTPATIENT OCCUPATIONAL THERAPY NEURO EVALUATION  Patient Name: Marvin Tapia MRN: [id number] DOB:Apr 21, 1971, 51 y.o., male Today's Date: 10/22/2022  PCP: Mordecai Maes, NP  REFERRING PROVIDER: Mordecai Maes, NP    Past Medical History:  Diagnosis Date   Diabetes mellitus without complication ([facility])    GERD (gastroesophageal reflux disease)    Hyperlipidemia    Hypertension    Past Surgical History:  Procedure Laterality Date   HERNIA REPAIR     TONSILLECTOMY     WOUND EXPLORATION  10/01/2020   Procedure: ABDOMINAL WOUND EXPLORATION;  Surgeon: [name], MD;  Location: [facility];  Service: General;;   Patient Active Problem List   Diagnosis Date Noted   Cerebrovascular accident ([facility]) 10/20/2022   Paresthesias 10/20/2022   Weakness of right hand 10/18/2022   Lumbar pain 08/09/2022   Skin lesion 04/23/2022   Right foot pain 01/25/2022   Colonoscopy refused 08/14/2021   Suture granuloma    Essential hypertension 05/15/2020   Pure hypercholesterolemia 05/15/2020   Type 2 diabetes mellitus without complication, without long-term current use of insulin ([facility]) 05/15/2020   GERD (gastroesophageal reflux disease) 05/15/2020    ONSET DATE: 10/20/2022    REFERRING DIAG: Y56.38 (ICD-10-CM) - Cerebrovascular accident (CVA) due to other mechanism ([facility])   THERAPY DIAG:  No diagnosis found.  Rationale for Evaluation and Treatment: Rehabilitation  SUBJECTIVE:   SUBJECTIVE STATEMENT: *** Pt accompanied by: {accompnied:27141}  PERTINENT HISTORY: HTN, DM   PAIN:  Are you having pain? No   PRECAUTIONS: None   WEIGHT BEARING RESTRICTIONS: No   FALLS: Has patient fallen in last 6 months? No   LIVING ENVIRONMENT: Lives with: lives with their family Lives in: House/apartment Stairs: Yes: External: none  steps; none Has following equipment at home: None   PLOF: Independent   PATIENT GOALS: "To get my arm/hand back to how it used to be."   OBJECTIVE:   HAND  DOMINANCE: {MISC; OT HAND DOMINANCE:[id number]}  ADLs: Overall ADLs: *** Transfers/ambulation related to ADLs: Eating: *** Grooming: *** UB Dressing: *** LB Dressing: *** Toileting: *** Bathing: *** Tub Shower transfers: *** Equipment: {equipment:25573}  IADLs: Shopping: *** Light housekeeping: *** Meal Prep: *** Community mobility: *** Medication management: *** Financial management: *** Handwriting: {OTWRITTENEXPRESSION:25361}  MOBILITY STATUS: Independent  ACTIVITY TOLERANCE: Activity tolerance: ***  FUNCTIONAL OUTCOME MEASURES: {OTFUNCTIONALMEASURES:27238}  UPPER EXTREMITY ROM     AROM Right (eval) Left (eval)  Shoulder flexion  WNL  Shoulder abduction  WNL  Elbow flexion  WNL  Elbow extension  WNL  Wrist flexion  WNL  Wrist extension  WNL  Wrist pronation  WNL  Wrist supination  WNL   Digit Composite Flexion  WNL  Digit Composite Extension  WNL  Digit Opposition  WNL  (Blank rows = not tested)  UPPER EXTREMITY MMT:     MMT Right (eval) Left (eval)  Shoulder flexion  WNL  Shoulder abduction  WNL  Elbow flexion  WNL  Elbow extension  WNL  (Blank rows = not tested)   HAND FUNCTION: {handfunction:27230}  COORDINATION: {otcoordination:27237}  SENSATION: {sensation:27233}  EDEMA: ***  MUSCLE TONE: {UETONE:25567}  COGNITION: Overall cognitive status: {cognition:24006}  VISION: Subjective report: *** Baseline vision: {OTBASELINEVISION:25363} Visual history: {OTVISUALHISTORY:25364}  VISION ASSESSMENT: {visionassessment:27231}  Patient has difficulty with following activities due to following visual impairments: ***  PERCEPTION: {Perception:25564}  PRAXIS: {Praxis:25565}  OBSERVATIONS: ***   TODAY'S TREATMENT:  DATE: ***   PATIENT EDUCATION: Education details: *** Person educated: {Person  educated:25204} Education method: {Education Method:25205} Education comprehension: {Education Comprehension:25206}  HOME EXERCISE PROGRAM: ***   GOALS: Goals reviewed with patient? {yes/no:20286}  SHORT TERM GOALS: Target date: ***  *** Baseline: Goal status: {GOALSTATUS:25110}  2.  *** Baseline:  Goal status: {GOALSTATUS:25110}  3.  *** Baseline:  Goal status: {GOALSTATUS:25110}  4.  *** Baseline:  Goal status: {GOALSTATUS:25110}  5.  *** Baseline:  Goal status: {GOALSTATUS:25110}  6.  *** Baseline:  Goal status: {GOALSTATUS:25110}  LONG TERM GOALS: Target date: ***  *** Baseline:  Goal status: {GOALSTATUS:25110}  2.  *** Baseline:  Goal status: {GOALSTATUS:25110}  3.  *** Baseline:  Goal status: {GOALSTATUS:25110}  4.  *** Baseline:  Goal status: {GOALSTATUS:25110}  5.  *** Baseline:  Goal status: {GOALSTATUS:25110}  6.  *** Baseline:  Goal status: {GOALSTATUS:25110}  ASSESSMENT:  CLINICAL IMPRESSION: Patient is a 51 y.o. male who was seen today for occupational therapy evaluation for ***. Hx includes ***. Patient currently presents *** baseline level of functioning demonstrating functional deficits and impairments as noted below. Pt would benefit from skilled OT services in the outpatient setting to work on impairments as noted below to help pt return to PLOF as able.     PERFORMANCE DEFICITS: in functional skills including {OT physical skills:25468}.  IMPAIRMENTS: are limiting patient from {OT performance deficits:25471}.   CO-MORBIDITIES; {Comorbidities:25485} that affects occupational performance. Patient will benefit from skilled OT to address above impairments and improve overall function.  MODIFICATION OR ASSISTANCE TO COMPLETE EVALUATION: {OT modification:25474}  OT OCCUPATIONAL PROFILE AND HISTORY: {OT PROFILE AND HISTORY:25484}  CLINICAL DECISION MAKING: {OT CDM:25475}  REHAB POTENTIAL: {rehabpotential:25112}  EVALUATION  COMPLEXITY: {Evaluation complexity:25115}    PLAN:  OT FREQUENCY: {rehab frequency:25116}  OT DURATION: {rehab duration:25117}  PLANNED INTERVENTIONS: {OT Interventions:25467}  RECOMMENDED OTHER SERVICES: ***  CONSULTED AND AGREED WITH PLAN OF CARE: {HYQ:65784}  PLAN FOR NEXT SESSION: Dennis Bast, OT 10/22/2022, 3:16 PM

## 2022-10-25 ENCOUNTER — Encounter: Payer: Self-pay | Admitting: Neurology

## 2022-10-25 ENCOUNTER — Ambulatory Visit: Payer: No Typology Code available for payment source | Admitting: Dermatology

## 2022-10-25 ENCOUNTER — Encounter: Payer: No Typology Code available for payment source | Admitting: Occupational Therapy

## 2022-10-29 ENCOUNTER — Ambulatory Visit (HOSPITAL_COMMUNITY): Payer: No Typology Code available for payment source | Attending: Nurse Practitioner

## 2022-10-29 DIAGNOSIS — I6389 Other cerebral infarction: Secondary | ICD-10-CM | POA: Diagnosis present

## 2022-10-29 LAB — ECHOCARDIOGRAM COMPLETE
Area-P 1/2: 3.86 cm2
S' Lateral: 2.4 cm

## 2022-10-29 NOTE — Progress Notes (Unsigned)
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 Hypertension Hyperlipidemia Type 2 diabetes mellitus  Continue stroke workup: CTA head Refer to cardiology for consideration of TEE 30 day cardiac event monitor Secondary stroke prevention as managed by PCP: May decrease aspirin to 81mg  daily Rosuvastatin.  LDL goal less than 70 Normotensive blood pressure Hgb A1c goal less than 7 Follow up 4-5 months.    Subjective:  Marvin Tapia is a 51 year old right-handed male with DM II, HTN, and HLD and former cigarette smoker who presents for stroke.  History supplemented by referring provider's notes.  CT head and MRI of brain personally reviewed.  On 10/15/2022, he was at work when he devloped sudden onset of right arm numbness and decreased grip strength and hand locked up, slight right leg weakness as well as slight slurred speech.  Symptoms lasted several hours.  [facility] went [facility] ED.  CT head showed no acute abnormality.  Due to prolonged wait time in the CT, he left.  MRI of head on 10/19/2022 demonstrated small early subacute infarcts in the left frontal lobe involving the pre and postcentral gyri and middle frontal gyrus.  Started ASA.  Lovastatin was changed to rosuvastatin  Carotid ultrasound on 10/20/2022 revealed 1-39% ICA stenosis bilaterally and antegrade flow of vertebral arteries bilaterally.  2D echocardiogram on 10/29/2022 showed LVEF 60-65% without valvulopathy or atrial level shunt.  Since the stroke he continues to have right sided numbness.  He also feels lightheaded at times.    Previous labs revealed Hgb A1c 7.7 on 08/09/2022 and LDL 111 with TG 174 on 04/23/2022.  PAST MEDICAL HISTORY: Past Medical History:  Diagnosis Date    Diabetes mellitus without complication ([facility])    GERD (gastroesophageal reflux disease)    Hyperlipidemia    Hypertension     PAST SURGICAL HISTORY: Past Surgical History:  Procedure Laterality Date   HERNIA REPAIR     TONSILLECTOMY     WOUND EXPLORATION  10/01/2020   Procedure: ABDOMINAL WOUND EXPLORATION;  Surgeon: 12/01/2020, MD;  Location: [facility];  Service: General;;    MEDICATIONS: Current Outpatient Medications on File Prior to Visit  Medication Sig Dispense Refill   aspirin 325 MG tablet Take 325 mg by mouth daily.     dapagliflozin propanediol (FARXIGA) 10 MG TABS tablet Take 1 tablet by mouth daily before breakfast. 90 tablet 1   glucose blood (FREESTYLE LITE) test strip Use as directed in the morning and at bedtime. 200 each 12   lisinopril (ZESTRIL) 20 MG tablet Take 1 tablet by mouth daily. 90 tablet 1   metFORMIN (GLUCOPHAGE) 1000 MG tablet TAKE 1 TABLET BY MOUTH TWICE DAILY 180 tablet 3   pantoprazole (PROTONIX) 40 MG tablet Take 1 tablet (40 mg total) by mouth daily. 90 tablet 0   rosuvastatin (CRESTOR) 10 MG tablet Take 1 tablet (10 mg total) by mouth daily. 90 tablet 3   No current facility-administered medications on file prior to visit.    ALLERGIES: Allergies  Allergen Reactions   Sulfa Antibiotics     Unknown    FAMILY HISTORY: Family History  Problem Relation Age of Onset   Hyperlipidemia Father    Heart disease Father    Diabetes Brother    Hypertension Brother  Objective:  Blood pressure 109/74, pulse 82, weight 216 lb (98 kg), SpO2 97 %. General: No acute distress.  Patient appears well-groomed.   Head:  Normocephalic/atraumatic Eyes:  fundi examined but not visualized Neck: supple, no paraspinal tenderness, full range of motion Back: No paraspinal tenderness Heart: regular rate and rhythm Lungs: Clear to auscultation bilaterally. Vascular: No carotid bruits. Neurological Exam: Mental status: alert and oriented to person, place, and  time, speech fluent and not dysarthric, language intact. Cranial nerves: CN I: not tested CN II: pupils equal, round and reactive to light, visual fields intact CN III, IV, VI:  full range of motion, no nystagmus, no ptosis CN V: facial sensation intact. CN VII: upper and lower face symmetric CN VIII: hearing intact CN IX, X: gag intact, uvula midline CN XI: sternocleidomastoid and trapezius muscles intact CN XII: tongue midline Bulk & Tone: normal, no fasciculations. Motor:  muscle strength 5/5 throughout Sensation:  Pinprick, temperature and vibratory sensation intact. Deep Tendon Reflexes:  2+ throughout,  toes downgoing.   Finger to nose testing:  Without dysmetria.   Heel to shin:  Without dysmetria.   Gait:  Normal station and stride.  Romberg negative.    Thank you for allowing me to take part in the care of this patient.  Metta Clines, DO  CC: Karl Ito, NP

## 2022-11-01 ENCOUNTER — Ambulatory Visit (INDEPENDENT_AMBULATORY_CARE_PROVIDER_SITE_OTHER): Payer: No Typology Code available for payment source | Admitting: Neurology

## 2022-11-01 ENCOUNTER — Encounter: Payer: Self-pay | Admitting: Nurse Practitioner

## 2022-11-01 ENCOUNTER — Encounter: Payer: Self-pay | Admitting: Neurology

## 2022-11-01 VITALS — BP 109/74 | HR 82 | Wt 216.0 lb

## 2022-11-01 DIAGNOSIS — I63412 Cerebral infarction due to embolism of left middle cerebral artery: Secondary | ICD-10-CM

## 2022-11-01 DIAGNOSIS — E7800 Pure hypercholesterolemia, unspecified: Secondary | ICD-10-CM

## 2022-11-01 DIAGNOSIS — E119 Type 2 diabetes mellitus without complications: Secondary | ICD-10-CM | POA: Diagnosis not present

## 2022-11-01 DIAGNOSIS — I10 Essential (primary) hypertension: Secondary | ICD-10-CM | POA: Diagnosis not present

## 2022-11-01 NOTE — Patient Instructions (Addendum)
Continue aspirin 81mg  daily Continue rosuvastatin Continue medication for blood pressure and diabetes Check CTA of head Refer to cardiology for consideration of transesophageal echocardiogram Will also order a 30 day cardiac event monitor Follow up 4-5 months.

## 2022-11-04 ENCOUNTER — Encounter: Payer: Self-pay | Admitting: Neurology

## 2022-11-09 ENCOUNTER — Ambulatory Visit
Admission: RE | Admit: 2022-11-09 | Discharge: 2022-11-09 | Disposition: A | Discharge status: Home / Self Care | Payer: No Typology Code available for payment source | Source: Ambulatory Visit | Attending: Neurology | Admitting: Neurology

## 2022-11-09 DIAGNOSIS — I63412 Cerebral infarction due to embolism of left middle cerebral artery: Secondary | ICD-10-CM

## 2022-11-09 MED ORDER — IOPAMIDOL (ISOVUE-370) INJECTION 76%
75.0000 mL | Freq: Once | INTRAVENOUS | Status: AC | PRN
  Administered 2022-11-09: 75 mL via INTRAVENOUS

## 2022-11-11 ENCOUNTER — Ambulatory Visit
Payer: No Typology Code available for payment source | Attending: Cardiovascular Disease | Admitting: Cardiovascular Disease

## 2022-11-11 ENCOUNTER — Other Ambulatory Visit (HOSPITAL_COMMUNITY): Payer: Self-pay

## 2022-11-11 ENCOUNTER — Encounter: Payer: Self-pay | Admitting: Cardiovascular Disease

## 2022-11-11 VITALS — BP 132/84 | HR 73 | Ht 71.0 in | Wt 219.0 lb

## 2022-11-11 DIAGNOSIS — Z87891 Personal history of nicotine dependence: Secondary | ICD-10-CM

## 2022-11-11 DIAGNOSIS — K219 Gastro-esophageal reflux disease without esophagitis: Secondary | ICD-10-CM

## 2022-11-11 DIAGNOSIS — I10 Essential (primary) hypertension: Secondary | ICD-10-CM

## 2022-11-11 DIAGNOSIS — I639 Cerebral infarction, unspecified: Secondary | ICD-10-CM | POA: Diagnosis not present

## 2022-11-11 DIAGNOSIS — Z79899 Other long term (current) drug therapy: Secondary | ICD-10-CM

## 2022-11-11 DIAGNOSIS — E119 Type 2 diabetes mellitus without complications: Secondary | ICD-10-CM

## 2022-11-11 DIAGNOSIS — E7800 Pure hypercholesterolemia, unspecified: Secondary | ICD-10-CM

## 2022-11-11 MED ORDER — ROSUVASTATIN CALCIUM 20 MG PO TABS
20.0000 mg | ORAL_TABLET | Freq: Every day | ORAL | 2 refills | Status: DC
  Filled 2022-11-11: qty 90, 90d supply, fill #0
  Filled 2023-03-06: qty 90, 90d supply, fill #1
  Filled 2023-07-12: qty 90, 90d supply, fill #2

## 2022-11-11 NOTE — Progress Notes (Signed)
NO PA Needed,  covered at 100 % as long as it is billed as Diagnostic, If billed as a DME covered at up to 2,500 and will need a PA.  RepJoni Reining Ref# [id number]

## 2022-11-11 NOTE — Patient Instructions (Signed)
Medication Instructions:  INCREASE- Rosuvastatin (Crestor) 20 mg by mouth daily  *If you need a refill on your cardiac medications before your next appointment, please call your pharmacy*   Lab Work: Lipo-protein a, TSH, CBC, CMP and Fasting Lipids in 2-3 Months  If you have labs (blood work) drawn today and your tests are completely normal, you will receive your results only by: MyChart Message (if you have MyChart) OR A paper copy in the mail If you have any lab test that is abnormal or we need to change your treatment, we will call you to review the results.   Testing/Procedures: Your physician has requested that you have a Coronary Calcium Score Test. For further information please visit [email or web address] Please follow instruction sheet, as given.    Follow-Up: At [facility], you and your health needs are our priority.  As part of our continuing mission to provide you with exceptional heart care, we have created designated Provider Care Teams.  These Care Teams include your primary Cardiologist (physician) and Advanced Practice Providers (APPs -  Physician Assistants and Nurse Practitioners) who all work together to provide you with the care you need, when you need it.  We recommend signing up for the patient portal called "MyChart".  Sign up information is provided on this After Visit Summary.  MyChart is used to connect with patients for Virtual Visits (Telemedicine).  Patients are able to view lab/test results, encounter notes, upcoming appointments, etc.  Non-urgent messages can be sent to your provider as well.   To learn more about what you can do with MyChart, go to [email or web address].    Your next appointment:   3 month(s)  The format for your next appointment:   In Person  Provider:   Dr Nicki Guadalajara   Other Instructions

## 2022-11-11 NOTE — Progress Notes (Signed)
Cardiology Office Note    Date:  11/17/2022   ID:  MACAULEY Tapia, DOB 1971/08/29, MRN [id number]  PCP:  Marvin Pitcher, NP  Cardiologist:  Marvin Majestic, MD   New cardiology consultation referred by Dr. Metta Tapia following a recent stroke.  Chief Complaint  Patient presents with   New Patient (Initial Visit)         History of Present Illness:  Marvin Tapia is a 51 y.o. male who is followed by  Marvin Ito, NP for primary care.  He has a history of hypertension for at least 10 to 12 years, diabetes mellitus for 3 to 4 years, and admits to hyperlipidemia for at least 10 to 12 years.  He has remote tobacco history.  On October 15, 2022 while at work he developed sudden onset of right arm numbness and decreased grip strength.  His hand locked up and he noted slight right leg weakness and slight slurred speech.  Symptoms lasted for several hours.  In [facility] ER a head CT did not show acute abnormality.  He ultimately left the ER.  An MRI of his head on October 19, 2022 demonstrated small early subacute infarcts in the frontal lobes involving the pre and postcentral gyri and middle frontal gyrus.  He was started on aspirin.  He had been on low-dose rosuvastatin and this was changed to rosuvastatin 10 mg.  A carotid ultrasound on October 20, 2022 demonstrated 1 to 39% bilateral ICA stenoses with antegrade flow in the vertebral arteries bilaterally.  He underwent a 2D echo Doppler study on October 29, 2022 which showed normal LV function with EF at 60 to 65%.  There was no regional wall motion abnormalities.  Right ventricular size was normal.  Valves were essentially normal.  There was no evidence for an atrial level shunt by color-flow Doppler.  He was recently evaluated by Dr. Metta Tapia of neurology.  During his evaluation on November 01, 2022 he continued to have some residual right-sided numbness.  He now has had normalization and return of right arm/hand function.  [name]  denies any chest tightness.  He denies any exertional dyspnea.  He is unaware of medical arrhythmias.  He denies presyncope or syncope.  He is scheduled to wear a 30-day cardiac event monitor which has not yet initiated.  He presents for cardiology evaluation   Past Medical History:  Diagnosis Date   Diabetes mellitus without complication ([place])    GERD (gastroesophageal reflux disease)    Hyperlipidemia    Hypertension     Past Surgical History:  Procedure Laterality Date   HERNIA REPAIR     TONSILLECTOMY     WOUND EXPLORATION  10/01/2020   Procedure: ABDOMINAL WOUND EXPLORATION;  Surgeon: Marvin Signs, MD;  Location: [facility];  Service: General;;    Current Medications: Outpatient Medications Prior to Visit  Medication Sig Dispense Refill   dapagliflozin propanediol (FARXIGA) 10 MG TABS tablet Take 1 tablet by mouth daily before breakfast. 90 tablet 1   glucose blood (FREESTYLE LITE) test strip Use as directed in the morning and at bedtime. 200 each 12   lisinopril (ZESTRIL) 20 MG tablet Take 1 tablet by mouth daily. 90 tablet 1   metFORMIN (GLUCOPHAGE) 1000 MG tablet TAKE 1 TABLET BY MOUTH TWICE DAILY 180 tablet 3   pantoprazole (PROTONIX) 40 MG tablet Take 1 tablet (40 mg total) by mouth daily. 90 tablet 0   rosuvastatin (CRESTOR) 10 MG  tablet Take 1 tablet (10 mg total) by mouth daily. 90 tablet 3   aspirin 325 MG tablet Take 325 mg by mouth daily.     No facility-administered medications prior to visit.     Allergies:   Sulfa antibiotics   Social History   Socioeconomic History   Marital status: Married    Spouse name: Not on file   Number of children: Not on file   Years of education: Not on file   Highest education level: Not on file  Occupational History   Not on file  Tobacco Use   Smoking status: Former    Packs/day: 1.00    Years: 20.00    Total pack years: 20.00    Types: Cigarettes    Quit date: 06/26/2021    Years since quitting: 1.3   Smokeless tobacco:  Never  Substance and Sexual Activity   Alcohol use: Not Currently    Alcohol/week: 0.0 standard drinks of alcohol   Drug use: Never   Sexual activity: Yes  Other Topics Concern   Not on file  Social History Narrative   Right handed   Drinks caffeine   Lives with wife and son   Armed forces operational officer    Social Determinants of Health   Financial Resource Strain: Not on file  Food Insecurity: Not on file  Transportation Needs: Not on file  Physical Activity: Not on file  Stress: Not on file  Social Connections: Not on file    Social history is notable that he was born in [place].  He has been living in [place] since 2005.  He is married for 33 years.  He has 2 children.  He is a Geophysicist/field seismologist for [employer].  There is a prior 25-year tobacco history.  At one time he had quit for 9 years then resumed smoking but has been quit for the past year.  He does not exercise outside work but admits that he is on his feet all day at work.   Family History:  The patient's family history includes Diabetes in his brother; Heart disease in his father; Hyperlipidemia in his father; Hypertension in his brother.  His mother is living at age 16.  Father died of a heart attack at age 63.  Maternal grandfather died of a heart attack at age 36.  He has 1 living brother age 20 with Bell's palsy.  He has 2 living sisters ages 41 and 10.  His children are 25 and 70 years old.  ROS General: Negative; No fevers, chills, or night sweats;  HEENT: Negative; No changes in vision or hearing, sinus congestion, difficulty swallowing Pulmonary: Negative; No cough, wheezing, shortness of breath, hemoptysis Cardiovascular: Negative; No chest pain, presyncope, syncope, palpitations GI: Negative; No nausea, vomiting, diarrhea, or abdominal pain GU: Negative; No dysuria, hematuria, or difficulty voiding Musculoskeletal: Negative; no myalgias, joint pain, or weakness Hematologic/Oncology: Negative; no easy bruising,  bleeding Endocrine: Negative; no heat/cold intolerance; no diabetes Neuro: Negative; no changes in balance, headaches Skin: Negative; No rashes or skin lesions Psychiatric: Negative; No behavioral problems, depression Sleep: He admits to snoring but he feels he sleeps well.  He denies any daytime sleepiness, hypersomnolence, bruxism, restless legs, hypnogognic hallucinations, no cataplexy Other comprehensive 14 point system review is negative.   PHYSICAL EXAM:   VS:  BP 132/84 (BP Location: Right Arm, Patient Position: Sitting, Cuff Size: Normal)   Pulse 73   Ht _0  (1.803 m)   Wt 219 lb (99.3 kg)  BMI 30.54 kg/m     Repeat blood pressure by me was 114/76  Wt Readings from Last 3 Encounters:  11/17/22 218 lb (98.9 kg)  11/11/22 219 lb (99.3 kg)  11/01/22 216 lb (98 kg)    General: Alert, oriented, no distress.  Skin: normal turgor, no rashes, warm and dry HEENT: Normocephalic, atraumatic. Pupils equal round and reactive to light; sclera anicteric; extraocular muscles intact;  Nose without nasal septal hypertrophy Mouth/Parynx benign; Mallinpatti scale 3 Neck: No JVD, no carotid bruits; normal carotid upstroke Lungs: clear to ausculatation and percussion; no wheezing or rales Chest wall: without tenderness to palpitation Heart: PMI not displaced, RRR, s1 s2 normal, 1/6 systolic murmur, no diastolic murmur, no rubs, gallops, thrills, or heaves Abdomen: soft, nontender; no hepatosplenomehaly, BS+; abdominal aorta nontender and not dilated by palpation. Back: no CVA tenderness Pulses 2+ Musculoskeletal: full range of motion, normal strength, no joint deformities Extremities: no clubbing cyanosis or edema, Homan's sign negative  Neurologic: grossly nonfocal; Cranial nerves grossly wnl Psychologic: Normal mood and affect   Studies/Labs Reviewed:   November 11, 2022 ECG (independently read by me): NSR at 73, No significant STT changes; no ectopy, normal intervals  Recent  Labs:    Latest Ref Rng & Units 10/15/2022   10:21 PM 10/15/2022    9:33 PM 08/09/2022    8:54 AM  BMP  Glucose 70 - 99 mg/dL 202  203  182   BUN 6 - 20 mg/dL _0 Creatinine 0.61 - 1.24 mg/dL 0.90  0.97  0.84   Sodium 135 - 145 mmol/L 141  140  140   Potassium 3.5 - 5.1 mmol/L 3.8  3.8  4.1   Chloride 98 - 111 mmol/L 102  105  103   CO2 22 - 32 mmol/L  26  29   Calcium 8.9 - 10.3 mg/dL  9.6  9.6         Latest Ref Rng & Units 10/15/2022    9:33 PM 08/09/2022    8:54 AM 04/23/2022    8:38 AM  Hepatic Function  Total Protein 6.5 - 8.1 g/dL 6.9  7.1  7.0   Albumin 3.5 - 5.0 g/dL 4.1  4.3  4.3   AST 15 - 41 U/L _1 ALT 0 - 44 U/L _2 Alk Phosphatase 38 - 126 U/L 71  78  72   Total Bilirubin 0.3 - 1.2 mg/dL 0.6  0.5  0.6        Latest Ref Rng & Units 10/15/2022   10:21 PM 10/15/2022    9:33 PM 04/23/2022    8:38 AM  CBC  WBC 4.0 - 10.5 K/uL  12.5  8.6   Hemoglobin 13.0 - 17.0 g/dL 15.0  15.9  15.2   Hematocrit 39.0 - 52.0 % 44.0  44.9  43.5   Platelets 150 - 400 K/uL  243  213.0    Lab Results  Component Value Date   MCV 86.8 10/15/2022   MCV 87.7 04/23/2022   MCV 87.8 08/14/2021   Lab Results  Component Value Date   TSH 2.16 08/14/2021   Lab Results  Component Value Date   HGBA1C 7.7 (A) 11/17/2022     BNP No results found for: "BNP"  ProBNP No results found for: "PROBNP"   Lipid Panel     Component Value Date/Time   CHOL 189 04/23/2022 0838   TRIG  175.0 (H) 04/23/2022 0838   HDL 42.90 04/23/2022 0838   CHOLHDL 4 04/23/2022 0838   VLDL 35.0 04/23/2022 0838   LDLCALC 111 (H) 04/23/2022 0838   LDLDIRECT 123.0 08/14/2021 1035     RADIOLOGY: CT ANGIO HEAD W OR WO CONTRAST  Result Date: 11/10/2022 EXAM: CT ANGIOGRAPHY HEAD TECHNIQUE: Multidetector CT imaging of the head was performed using the standard protocol during bolus administration of intravenous contrast. Multiplanar CT image reconstructions and MIPs were  obtained to evaluate the vascular anatomy. RADIATION DOSE REDUCTION: This exam was performed according to the departmental dose-optimization program which includes automated exposure control, adjustment of the mA and/or kV according to patient size and/or use of iterative reconstruction technique. CONTRAST:  44m ISOVUE-370 IOPAMIDOL (ISOVUE-370) INJECTION 76% COMPARISON:  MRI of the brain and head CT October 19 2022. FINDINGS: CT HEAD Brain: No evidence of acute infarction, hemorrhage, hydrocephalus, extra-axial collection or mass lesion/mass effect. 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 Vascular: No hyperdense vessel or unexpected calcification. Skull: Normal. Negative for fracture or focal lesion. Sinuses: No acute or significant finding. Other: None. CTA HEAD Anterior circulation: No significant stenosis, proximal occlusion, aneurysm, or vascular malformation. Increased tortuosity of the upper cervical segment of the right ICA. Posterior circulation: No significant stenosis, proximal occlusion, aneurysm, or vascular malformation. Venous sinuses: As permitted by contrast timing, patent. Anatomic variants: None significant. Review of the MIP images confirms the above findings. IMPRESSION: 1. No acute intracranial abnormality. 2. 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 3. No intracranial large vessel occlusion, significant stenosis, aneurysm, or vascular malformation. Electronically Signed   By: KPedro EarlsM.D.   On: 11/10/2022 11:25   ECHOCARDIOGRAM COMPLETE  Result Date: 10/29/2022    ECHOCARDIOGRAM REPORT   Patient Name:   SANGELES PAOLUCCIDate of Exam: 10/29/2022 Medical Rec #:  [id number]        Height:       70.2 in Accession #:    [id number]       Weight:       212.4 lb Date of Birth:  5Mar 31, 1972         BSA:          2.147 m Patient Age:    89years          BP:           118/76 mmHg Patient Gender: M                 HR:           81 bpm.  [facility]  [facility]: 2[facility] Echo, 3D Echo, Cardiac Doppler and Color Doppler Indications:    I63.9 CVA  History:        Patient has no prior history of Echocardiogram examinations.                 Stroke; Risk Factors:Hypertension, Diabetes, Dyslipidemia,                 Family History of Coronary Artery Disease and Former Smoker.  Sonographer:    BDeliah BostonRDCS Referring Phys: JAlyson LocketCABLE IMPRESSIONS  1. Left ventricular ejection fraction, by estimation, is 60 to 65%. The left ventricle has normal function. The left ventricle has no regional wall motion abnormalities. Left ventricular diastolic parameters are indeterminate.  2. Right ventricular systolic function is normal. The right ventricular size is normal. Tricuspid regurgitation signal is inadequate for  assessing PA pressure.  3. The mitral valve is normal in structure. No evidence of mitral valve regurgitation.  4. The aortic valve is tricuspid. Aortic valve regurgitation is not visualized.  5. The inferior vena cava is normal in size with greater than 50% respiratory variability, suggesting right atrial pressure of 3 mmHg. FINDINGS  Left Ventricle: Left ventricular ejection fraction, by estimation, is 60 to 65%. The left ventricle has normal function. The left ventricle has no regional wall motion abnormalities. The left ventricular internal cavity size was normal in size. There is  no left ventricular hypertrophy. Left ventricular diastolic parameters are indeterminate. Right Ventricle: The right ventricular size is normal. Right ventricular systolic function is normal. Tricuspid regurgitation signal is inadequate for assessing PA pressure. Left Atrium: Left atrial size was normal in size. Right Atrium: Right atrial size was normal in size. Pericardium: There is no evidence of pericardial effusion. Mitral Valve: The mitral valve is normal in structure. No evidence of mitral valve regurgitation. Tricuspid Valve: The tricuspid valve  is grossly normal. Tricuspid valve regurgitation is not demonstrated. Aortic Valve: The aortic valve is tricuspid. Aortic valve regurgitation is not visualized. Pulmonic Valve: The pulmonic valve was normal in structure. Pulmonic valve regurgitation is not visualized. Aorta: The aortic root and ascending aorta are structurally normal, with no evidence of dilitation. Venous: The inferior vena cava is normal in size with greater than 50% respiratory variability, suggesting right atrial pressure of 3 mmHg. IAS/Shunts: No atrial level shunt detected by color flow Doppler.  LEFT VENTRICLE PLAX 2D LVIDd:         4.30 cm   Diastology LVIDs:         2.40 cm   LV e' medial:    6.20 cm/s LV PW:         0.90 cm   LV E/e' medial:  13.2 LV IVS:        1.00 cm   LV e' lateral:   7.07 cm/s LVOT diam:     2.10 cm   LV E/e' lateral: 11.6 LV SV:         62 LV SV Index:   29 LVOT Area:     3.46 cm                           3D Volume EF:                          3D EF:        68 %                          LV EDV:       95 ml                          LV ESV:       30 ml                          LV SV:        65 ml RIGHT VENTRICLE RV Basal diam:  3.70 cm RV S prime:     15.20 cm/s TAPSE (M-mode): 1.7 cm LEFT ATRIUM             Index        RIGHT ATRIUM  Index LA diam:        4.30 cm 2.00 cm/m   RA Area:     17.90 cm LA Vol (A2C):   63.5 ml 29.58 ml/m  RA Volume:   49.00 ml  22.83 ml/m LA Vol (A4C):   57.0 ml 26.55 ml/m LA Biplane Vol: 61.2 ml 28.51 ml/m  AORTIC VALVE LVOT Vmax:   108.00 cm/s LVOT Vmean:  67.900 cm/s LVOT VTI:    0.178 m  AORTA Ao Root diam: 3.00 cm Ao Asc diam:  3.60 cm MITRAL VALVE MV Area (PHT): cm         SHUNTS MV Decel Time: 197 msec    Systemic VTI:  0.18 m MV E velocity: 81.90 cm/s  Systemic Diam: 2.10 cm MV A velocity: 79.30 cm/s MV E/A ratio:  1.03 Mary Scientist, physiological signed by Phineas Inches Signature Date/Time: 10/29/2022/3:46:15 PM    Final    VAS US CAROTID  Result Date:  10/20/2022 Carotid Arterial Duplex Study Patient Name:  QAADIR KENT  Date of Exam:   10/20/2022 Medical Rec #: [id number]          Accession #:    [id number] Date of Birth: [birth year]           Patient Gender: M Patient Age:   74 years Exam Location:  [facility] Procedure:      VAS US CAROTID Referring Phys: Jeneen Rinks CABLE --------------------------------------------------------------------------------  Indications:       CVA and right arm numbness. Risk Factors:      Hypertension, hyperlipidemia, past history of smoking. Comparison Study:  No priors. Performing Technologist: Oda Cogan RDMS, RVT  Examination Guidelines: A complete evaluation includes B-mode imaging, spectral Doppler, color Doppler, and power Doppler as needed of all accessible portions of each vessel. Bilateral testing is considered an integral part of a complete examination. Limited examinations for reoccurring indications may be performed as noted.  Right Carotid Findings: +----------+--------+--------+--------+------------------+------------------+           PSV cm/sEDV cm/sStenosisPlaque DescriptionComments           +----------+--------+--------+--------+------------------+------------------+ CCA Prox  112     29                                                   +----------+--------+--------+--------+------------------+------------------+ CCA Distal112     24                                intimal thickening +----------+--------+--------+--------+------------------+------------------+ ICA Prox  83      21                                intimal thickening +----------+--------+--------+--------+------------------+------------------+ ICA Distal98      35                                                   +----------+--------+--------+--------+------------------+------------------+ ECA       166     33                                                    +----------+--------+--------+--------+------------------+------------------+ +----------+--------+-------+----------------+-------------------+  PSV cm/sEDV cmsDescribe        Arm Pressure (mmHG) +----------+--------+-------+----------------+-------------------+ [id number]            Multiphasic, WNL                    +----------+--------+-------+----------------+-------------------+ +---------+--------+--+--------+--+---------+ VertebralPSV cm/s68EDV cm/s24Antegrade +---------+--------+--+--------+--+---------+  Left Carotid Findings: +----------+--------+--------+--------+------------------+------------------+           PSV cm/sEDV cm/sStenosisPlaque DescriptionComments           +----------+--------+--------+--------+------------------+------------------+ CCA Prox  150     22                                                   +----------+--------+--------+--------+------------------+------------------+ CCA Distal114     24                                intimal thickening +----------+--------+--------+--------+------------------+------------------+ ICA Prox  67      27                                intimal thickening +----------+--------+--------+--------+------------------+------------------+ ICA Distal94      36                                                   +----------+--------+--------+--------+------------------+------------------+ ECA       109     21                                                   +----------+--------+--------+--------+------------------+------------------+ +----------+--------+--------+----------------+-------------------+           PSV cm/sEDV cm/sDescribe        Arm Pressure (mmHG) +----------+--------+--------+----------------+-------------------+ [id number]             Multiphasic, WNL                    +----------+--------+--------+----------------+-------------------+  +---------+--------+--+--------+--+---------+ VertebralPSV cm/s47EDV cm/s17Antegrade +---------+--------+--+--------+--+---------+   Summary: Right Carotid: Velocities in the right ICA are consistent with a 1-39% stenosis. Left Carotid: Velocities in the left ICA are consistent with a 1-39% stenosis. Vertebrals:  Bilateral vertebral arteries demonstrate antegrade flow. Subclavians: Normal flow hemodynamics were seen in bilateral subclavian              arteries. *See table(s) above for measurements and observations.  Electronically signed by Antony Contras MD on 10/20/2022 at 4:11:56 PM.    Final    MR BRAIN W WO CONTRAST  Result Date: 10/19/2022 EXAM: MRI HEAD WITHOUT AND WITH CONTRAST TECHNIQUE: Multiplanar, multiecho pulse sequences of the brain and surrounding structures were obtained without and with intravenous contrast. CONTRAST:  52m GADAVIST GADOBUTROL 1 MMOL/ML IV SOLN COMPARISON:  CT head 10/16/2022 FINDINGS: Brain: There is a small focus of diffusion restriction in the left precentral gyrus with associated FLAIR signal abnormality. There is faint diffusion restriction and FLAIR signal abnormality in the postcentral gyrus. There are additional punctate foci of diffusion restriction in the left middle frontal gyrus with associated  FLAIR signal abnormality. Findings are consistent with early subacute infarcts, likely present in the precentral gyrus on the prior CT from 10/16/2022 in retrospect. There is no hemorrhage or mass effect. There is minimal enhancement in the precentral gyrus related to the subacute ischemia. There is no other evidence of acute infarct. There is no acute intracranial hemorrhage or extra-axial fluid collection. Background parenchymal volume is normal. The ventricles are normal in size. Gray-white differentiation is otherwise preserved. There is minimal background chronic small-vessel ischemic change. There is no suspicious parenchymal signal abnormality. There is no mass  lesion or other abnormal enhancement. There is no mass effect or midline shift. Vascular: Normal flow voids. Skull and upper cervical spine: Normal marrow signal. Sinuses/Orbits: The paranasal sinuses are clear. The globes and orbits are unremarkable. Other: None. IMPRESSION: 1. Small early subacute infarcts in the left frontal lobe involving the pre and postcentral gyri and middle frontal gyrus. 2. Minimal underlying chronic small vessel ischemic change. Findings were discussed with Dr. Glori Bickers at 12:25 p.m. on 10/19/2022. Electronically Signed   By: Valetta Mole M.D.   On: 10/19/2022 12:45     Additional studies/ records that were reviewed today include:  I reviewed the records from from [facility] on October 20.  Head CT, MRI of head, carotid ultrasound, and 2D echo Doppler studies were reviewed as were notes of Dr. Glori Bickers, Penelope Galas, NP, and Dr. Metta Tapia.  ECHO: 10/29/2022 IMPRESSIONS   1. Left ventricular ejection fraction, by estimation, is 60 to 65%. The  left ventricle has normal function. The left ventricle has no regional  wall motion abnormalities. Left ventricular diastolic parameters are  indeterminate.   2. Right ventricular systolic function is normal. The right ventricular  size is normal. Tricuspid regurgitation signal is inadequate for assessing  PA pressure.   3. The mitral valve is normal in structure. No evidence of mitral valve  regurgitation.   4. The aortic valve is tricuspid. Aortic valve regurgitation is not  visualized.   5. The inferior vena cava is normal in size with greater than 50%  respiratory variability, suggesting right atrial pressure of 3 mmHg.   FINDINGS   Left Ventricle: Left ventricular ejection fraction, by estimation, is 60  to 65%. The left ventricle has normal function. The left ventricle has no  regional wall motion abnormalities. The left ventricular internal cavity  size was normal in size. There is   no left ventricular hypertrophy. Left  ventricular diastolic parameters  are indeterminate.   Right Ventricle: The right ventricular size is normal. Right ventricular  systolic function is normal. Tricuspid regurgitation signal is inadequate  for assessing PA pressure.   Left Atrium: Left atrial size was normal in size.   Right Atrium: Right atrial size was normal in size.   Pericardium: There is no evidence of pericardial effusion.   Mitral Valve: The mitral valve is normal in structure. No evidence of  mitral valve regurgitation.   Tricuspid Valve: The tricuspid valve is grossly normal. Tricuspid valve  regurgitation is not demonstrated.   Aortic Valve: The aortic valve is tricuspid. Aortic valve regurgitation is  not visualized.   Pulmonic Valve: The pulmonic valve was normal in structure. Pulmonic valve  regurgitation is not visualized.   Aorta: The aortic root and ascending aorta are structurally normal, with  no evidence of dilitation.   Venous: The inferior vena cava is normal in size with greater than 50%  respiratory variability, suggesting right atrial pressure of 3 mmHg.  IAS/Shunts: No atrial level shunt detected by color flow Doppler.     LEFT VENTRICLE  PLAX 2D  LVIDd:         4.30 cm   Diastology  LVIDs:         2.40 cm   LV e' medial:    6.20 cm/s  LV PW:         0.90 cm   LV E/e' medial:  13.2  LV IVS:        1.00 cm   LV e' lateral:   7.07 cm/s  LVOT diam:     2.10 cm   LV E/e' lateral: 11.6  LV SV:         62  LV SV Index:   29  LVOT Area:     3.46 cm                             3D Volume EF:                           3D EF:        68 %                           LV EDV:       95 ml                           LV ESV:       30 ml                           LV SV:        65 ml   RIGHT VENTRICLE  RV Basal diam:  3.70 cm  RV S prime:     15.20 cm/s  TAPSE (M-mode): 1.7 cm   LEFT ATRIUM             Index        RIGHT ATRIUM           Index  LA diam:        4.30 cm 2.00 cm/m   RA Area:      17.90 cm  LA Vol (A2C):   63.5 ml 29.58 ml/m  RA Volume:   49.00 ml  22.83 ml/m  LA Vol (A4C):   57.0 ml 26.55 ml/m  LA Biplane Vol: 61.2 ml 28.51 ml/m   AORTIC VALVE  LVOT Vmax:   108.00 cm/s  LVOT Vmean:  67.900 cm/s  LVOT VTI:    0.178 m    AORTA  Ao Root diam: 3.00 cm  Ao Asc diam:  3.60 cm   MITRAL VALVE  MV Area (PHT): cm         SHUNTS  MV Decel Time: 197 msec    Systemic VTI:  0.18 m  MV E velocity: 81.90 cm/s  Systemic Diam: 2.10 cm  MV A velocity: 79.30 cm/s  MV E/A ratio:  1.03    ASSESSMENT:    1. Essential hypertension   2. Pure hypercholesterolemia   3. Cerebrovascular accident (CVA), unspecified mechanism ([place])   4. Type 2 diabetes mellitus without complication, without long-term current use of insulin ([place])   5. Gastroesophageal reflux disease without esophagitis   6. Prior history of tobacco use   7. Medication  management     PLAN:  Mr. Annie Main Vanamburg is a 51 year old gentleman who has cardiovascular comorbidities including hyperlipidemia for 10 to 12 years, hypertension for 10 to 12 years, diabetes mellitus for 3 to 4 years, and prior tobacco use.  He recently developed sudden onset of right arm numbness with decreased grip strength, right hand locking up, slight right leg weakness and slight slurred speech.  He ultimately was found to have small early subacute infarcts in the left frontal lobe involving the pre and postcentral gyri and middle frontal gyrus.  He had persistent symptoms following the acute event but ultimately he believes now he has had normalization of function and can move his hand and fingers with full motion without difficulty.  He was found to have mild carotid plaque in 1 to 39% on carotid ultrasound and had antegrade flow of the vertebral arteries bilaterally.  He is 2D echo Doppler study showed vigorous LV function with EF at 60 to 65%.  He did not have a wall motion abnormality and had normal RV function.  Valves were essentially  normal.  He had normal biatrial size.  There was no evidence for a PFO or any shunt detected by color-flow Doppler.  He is unaware of any history of atrial fibrillation.  He is scheduled to wear a 30-day event monitor for assessment of potential cardiac dysrhythmia.  He is now on baby aspirin 81 mg.  Recent laboratory was reviewed reviewed.  On April 23, 2022 total cholesterol was 189 triglycerides 175 HDL 42, VLDL 35 and LDL 111.  He was on low-dose lovastatin which was changed to rosuvastatin 10 mg.  I have recommended further titration of rosuvastatin to at least 20 mg with LDL goal less than 70.  I am scheduling him for coronary calcium score to assess for coronary calcification.  He does not have any chest pain or exertional dyspnea.  In 2 to 3 months I am recommending he undergo follow-up laboratory with a comprehensive metabolic panel, CBC, TSH, fasting lipid panel in addition to LP(a) assessment.  With his 2D echo Doppler study essentially normal without evidence for shunt I have not scheduled him for a TEE.  His blood pressure today is controlled and a repeat by me was 114/76 on lisinopril 20 mg daily.  He is on [place] and metformin for diabetes mellitus and pantoprazole 40 mg for GERD.  I will see him in 3 months for reevaluation or sooner as needed.   Medication Adjustments/Labs and Tests Ordered: Current medicines are reviewed at length with the patient today.  Concerns regarding medicines are outlined above.  Medication changes, Labs and Tests ordered today are listed in the Patient Instructions below. Patient Instructions  Medication Instructions:  INCREASE- Rosuvastatin (Crestor) 20 mg by mouth daily  *If you need a refill on your cardiac medications before your next appointment, please call your pharmacy*   Lab Work: Lipo-protein a, TSH, CBC, CMP and Fasting Lipids in 2-3 Months  If you have labs (blood work) drawn today and your tests are completely normal, you will receive your  results only by: [place] (if you have MyChart) OR A paper copy in the mail If you have any lab test that is abnormal or we need to change your treatment, we will call you to review the results.   Testing/Procedures: Your physician has requested that you have a Coronary Calcium Score Test. For further information please visit [email or web address]. Please follow instruction sheet, as given.    Follow-Up:  At [facility], you and your health needs are our priority.  As part of our continuing mission to provide you with exceptional heart care, we have created designated Provider Care Teams.  These Care Teams include your primary Cardiologist (physician) and Advanced Practice Providers (APPs -  Physician Assistants and Nurse Practitioners) who all work together to provide you with the care you need, when you need it.  We recommend signing up for the patient portal called "MyChart".  Sign up information is provided on this After Visit Summary.  MyChart is used to connect with patients for Virtual Visits (Telemedicine).  Patients are able to view lab/test results, encounter notes, upcoming appointments, etc.  Non-urgent messages can be sent to your provider as well.   To learn more about what you can do with MyChart, go to [email or web address].    Your next appointment:   3 month(s)  The format for your next appointment:   In Person  Provider:   Dr Marvin Tapia   Other Instructions            Signed, Marvin Majestic, MD  11/17/2022 12:34 PM    [facility] [address], [place], [place], [place]  [address] Phone: [phone]

## 2022-11-17 ENCOUNTER — Ambulatory Visit (INDEPENDENT_AMBULATORY_CARE_PROVIDER_SITE_OTHER): Payer: No Typology Code available for payment source | Admitting: Nurse Practitioner

## 2022-11-17 ENCOUNTER — Encounter: Payer: Self-pay | Admitting: Nurse Practitioner

## 2022-11-17 ENCOUNTER — Encounter: Payer: Self-pay | Admitting: Cardiovascular Disease

## 2022-11-17 ENCOUNTER — Other Ambulatory Visit (HOSPITAL_COMMUNITY): Payer: Self-pay

## 2022-11-17 VITALS — BP 130/82 | HR 84 | Temp 98.1°F | Resp 16 | Ht 71.0 in | Wt 218.0 lb

## 2022-11-17 DIAGNOSIS — R29898 Other symptoms and signs involving the musculoskeletal system: Secondary | ICD-10-CM

## 2022-11-17 DIAGNOSIS — I10 Essential (primary) hypertension: Secondary | ICD-10-CM

## 2022-11-17 DIAGNOSIS — E119 Type 2 diabetes mellitus without complications: Secondary | ICD-10-CM | POA: Diagnosis not present

## 2022-11-17 DIAGNOSIS — I639 Cerebral infarction, unspecified: Secondary | ICD-10-CM

## 2022-11-17 DIAGNOSIS — E7800 Pure hypercholesterolemia, unspecified: Secondary | ICD-10-CM

## 2022-11-17 DIAGNOSIS — R202 Paresthesia of skin: Secondary | ICD-10-CM

## 2022-11-17 LAB — POCT GLYCOSYLATED HEMOGLOBIN (HGB A1C): Hemoglobin A1C: 7.7 % — AB (ref 4.0–5.6)

## 2022-11-17 MED ORDER — TIRZEPATIDE 2.5 MG/0.5ML ~~LOC~~ SOAJ
2.5000 mg | SUBCUTANEOUS | 0 refills | Status: DC
  Filled 2022-11-17: qty 2, 28d supply, fill #0

## 2022-11-17 NOTE — Assessment & Plan Note (Signed)
Resolved status post CVA 

## 2022-11-17 NOTE — Assessment & Plan Note (Signed)
Patient was switched to rosuvastatin 10 has followed up with cardiology and been bumped up to rosuvastatin 20 mg.  Patient tolerating medication currently has labs scheduled through cardiology in 2 to 3 months.

## 2022-11-17 NOTE — Assessment & Plan Note (Signed)
Suboptimal control with A1c of 7.7.  Given patient has had a cardiovascular event I did leave to go order 7 under 6.5 if he can tolerate it.  Patient already on metformin with some adherence issues and [place].  Patient denies any history of medullary thyroid cancer, multiple endocrine neoplasia syndrome type II, or pancreatitis.  We will start patient on Mounjaro 2.5 mg once weekly for 4 weeks then titrate up to Mounjaro 5 mg once weekly forward until he sees me in office.

## 2022-11-17 NOTE — Assessment & Plan Note (Signed)
Patient's blood pressure controlled in office today.  Continue taking medication as prescribed.  Continue taking lisinopril

## 2022-11-17 NOTE — Assessment & Plan Note (Signed)
Patient has went through PT/OT.  He has had an echocardiogram, ultrasound bilateral carotids.  He has been followed by neurology and cardiology now.  Patient's symptoms have completely resolved.  Continue following with specialist taking medication as prescribed.

## 2022-11-17 NOTE — Assessment & Plan Note (Signed)
Resolved status post CVA

## 2022-11-17 NOTE — Patient Instructions (Signed)
Nice to see you today I want to see you in 3 months, sooner if you need me We will start with [facility] 2.5mg  once a week. If you do well with that the first month I will increase it to 5mg  a week I would like for you to check your sugar daily at home

## 2022-11-17 NOTE — Progress Notes (Signed)
Established Patient Office Visit  Subjective   Patient ID: Marvin Tapia, male    DOB: [birth year]  Age: 51 y.o. MRN: [id number]  Chief Complaint  Patient presents with   Diabetes    Follow up   f/u post stroke    Doing better.    HPI  DM2: Has the supplies to check his sugar at home but does not do it often. He has been taking his medication as prescribed. States that he does forget the second dose of metformin frequent   CVA: Patient had an acute CVA picked up outpatient MRI.  Patient has been seen and evaluated by neurology and cardiology.  Patient is also done PT OT.  Patient states symptoms have fully resolved.  Patient's cardiologist Dr. Tresa Endo increased rosuvastatin to 20 mg.  Neurologist did do a CT angio head and neck that came back normal with no malformations.  Patient does have a cardiac CT scheduled in 6 days to look for coronary artery disease.   Review of Systems  Constitutional:  Negative for chills and fever.  Respiratory:  Negative for shortness of breath.   Cardiovascular:  Negative for chest pain.  Gastrointestinal:  Negative for abdominal pain, constipation, nausea and vomiting.  Neurological:  Negative for tingling, weakness and headaches.      Objective:     BP 130/82   Pulse 84   Temp 98.1 F (36.7 C)   Resp 16   Ht 5\' 11"  (1.803 m)   Wt 218 lb (98.9 kg)   SpO2 96%   BMI 30.40 kg/m    Physical Exam Vitals and nursing note reviewed.  Constitutional:      Appearance: Normal appearance.  Cardiovascular:     Rate and Rhythm: Normal rate and regular rhythm.     Heart sounds: Normal heart sounds.  Pulmonary:     Effort: Pulmonary effort is normal.     Breath sounds: Normal breath sounds.  Abdominal:     General: Bowel sounds are normal.  Neurological:     General: No focal deficit present.     Mental Status: He is alert.     Comments: Bilateral upper extremity strength 5/5      Results for orders placed or performed in visit on  11/17/22  POCT glycosylated hemoglobin (Hb A1C)  Result Value Ref Range   Hemoglobin A1C 7.7 (A) 4.0 - 5.6 %   HbA1c POC (<> result, manual entry)     HbA1c, POC (prediabetic range)     HbA1c, POC (controlled diabetic range)        The ASCVD Risk score (Arnett DK, et al., 2019) failed to calculate for the following reasons:   The patient has a prior MI or stroke diagnosis    Assessment & Plan:   Problem List Items Addressed This Visit       Cardiovascular and Mediastinum   Essential hypertension    Patient's blood pressure controlled in office today.  Continue taking medication as prescribed.  Continue taking lisinopril      Relevant Medications   ASPIRIN 81 PO   Cerebrovascular accident [facility])    Patient has went through PT/OT.  He has had an echocardiogram, ultrasound bilateral carotids.  He has been followed by neurology and cardiology now.  Patient's symptoms have completely resolved.  Continue following with specialist taking medication as prescribed.      Relevant Medications   ASPIRIN 81 PO     Endocrine   Type 2 diabetes mellitus  without complication, without long-term current use of insulin (HCC) - Primary    Suboptimal control with A1c of 7.7.  Given patient has had a cardiovascular event I did leave to go order 7 under 6.5 if he can tolerate it.  Patient already on metformin with some adherence issues and [place].  Patient denies any history of medullary thyroid cancer, multiple endocrine neoplasia syndrome type II, or pancreatitis.  We will start patient on Mounjaro 2.5 mg once weekly for 4 weeks then titrate up to Mounjaro 5 mg once weekly forward until he sees me in office.      Relevant Medications   ASPIRIN 81 PO   tirzepatide (MOUNJARO) 2.5 MG/0.5ML Pen   Other Relevant Orders   POCT glycosylated hemoglobin (Hb A1C) (Completed)     Other   Pure hypercholesterolemia    Patient was switched to rosuvastatin 10 has followed up with cardiology and been bumped  up to rosuvastatin 20 mg.  Patient tolerating medication currently has labs scheduled through cardiology in 2 to 3 months.      Relevant Medications   ASPIRIN 81 PO   Weakness of right hand    Resolved status post CVA      Paresthesias    Resolved status post CVA.       Return in about 3 months (around 02/17/2023) for DM.    [name], NP

## 2022-11-23 ENCOUNTER — Ambulatory Visit
Admission: RE | Admit: 2022-11-23 | Discharge: 2022-11-23 | Disposition: A | Discharge status: Home / Self Care | Payer: No Typology Code available for payment source | Source: Ambulatory Visit | Attending: Cardiovascular Disease | Admitting: Cardiovascular Disease

## 2022-11-23 ENCOUNTER — Other Ambulatory Visit: Payer: Self-pay | Admitting: Nurse Practitioner

## 2022-11-23 ENCOUNTER — Other Ambulatory Visit (HOSPITAL_COMMUNITY): Payer: Self-pay

## 2022-11-23 DIAGNOSIS — E119 Type 2 diabetes mellitus without complications: Secondary | ICD-10-CM

## 2022-11-23 DIAGNOSIS — I639 Cerebral infarction, unspecified: Secondary | ICD-10-CM | POA: Insufficient documentation

## 2022-11-25 ENCOUNTER — Other Ambulatory Visit (HOSPITAL_COMMUNITY): Payer: Self-pay

## 2022-11-25 MED ORDER — DAPAGLIFLOZIN PROPANEDIOL 10 MG PO TABS
10.0000 mg | ORAL_TABLET | Freq: Every day | ORAL | 1 refills | Status: DC
  Filled 2022-11-25: qty 90, 90d supply, fill #0
  Filled 2023-03-06: qty 30, 30d supply, fill #1
  Filled 2023-04-17: qty 30, 30d supply, fill #2
  Filled 2023-05-19: qty 30, 30d supply, fill #3

## 2022-12-05 ENCOUNTER — Encounter: Payer: Self-pay | Admitting: Nurse Practitioner

## 2022-12-06 ENCOUNTER — Telehealth (INDEPENDENT_AMBULATORY_CARE_PROVIDER_SITE_OTHER): Payer: No Typology Code available for payment source | Admitting: Internal Medicine

## 2022-12-06 ENCOUNTER — Encounter: Payer: Self-pay | Admitting: Internal Medicine

## 2022-12-06 VITALS — BP 121/73 | Temp 98.9°F

## 2022-12-06 DIAGNOSIS — U071 COVID-19: Secondary | ICD-10-CM | POA: Diagnosis not present

## 2022-12-06 MED ORDER — NIRMATRELVIR/RITONAVIR (PAXLOVID)TABLET: 3.0000 | ORAL_TABLET | Freq: Two times a day (BID) | ORAL | 0 refills | Status: AC

## 2022-12-06 MED ORDER — BENZONATATE 200 MG PO CAPS: 200.0000 mg | ORAL_CAPSULE | Freq: Three times a day (TID) | ORAL | 0 refills | Status: DC | PRN

## 2022-12-06 NOTE — Assessment & Plan Note (Signed)
Some systemic symptoms--fever, fatigue, etc Discussed continuing the tylenol Isolation through this week---in his camper for now Rx for paxlovid and benzonatate To ER if develops trouble breathing

## 2022-12-06 NOTE — Telephone Encounter (Signed)
Spoke to patient and was advised that he tested positive for covid yesterday.  Patient scheduled for a virtual visit today 12/06/22 with Dr. Alphonsus Sias at 2:45. Patient stated that he has had a fever and chills. Patient was given ER precautions and he verbalized understanding.

## 2022-12-06 NOTE — Progress Notes (Signed)
Subjective:    Patient ID: Marvin Tapia, male    DOB: 11/18/1971, 51 y.o.   MRN: [id number]  HPI Video virtual visit due to COVID infection Identification done Reviewed limitations and billing and he gave consent Participants---patient in his home (actually in the camper) and I am in my office  Started feeling bad yesterday Fever most of the day Congestion and coughing up stuff Chills and shakes yesterday---better with tylenol No SOB--just bringing up the phlegm No ear pain No headache  COVID test rapidly positive  No other meds than tylenol  Current Outpatient Medications on File Prior to Visit  Medication Sig Dispense Refill   ASPIRIN 81 PO Take by mouth. 1 tablet once daily     dapagliflozin propanediol (FARXIGA) 10 MG TABS tablet Take 1 tablet by mouth daily before breakfast. 90 tablet 1   glucose blood (FREESTYLE LITE) test strip Use as directed in the morning and at bedtime. 200 each 12   lisinopril (ZESTRIL) 20 MG tablet Take 1 tablet by mouth daily. 90 tablet 1   metFORMIN (GLUCOPHAGE) 1000 MG tablet TAKE 1 TABLET BY MOUTH TWICE DAILY 180 tablet 3   pantoprazole (PROTONIX) 40 MG tablet Take 1 tablet (40 mg total) by mouth daily. 90 tablet 0   rosuvastatin (CRESTOR) 20 MG tablet Take 1 tablet (20 mg total) by mouth daily. 90 tablet 2   tirzepatide (MOUNJARO) 2.5 MG/0.5ML Pen Inject 2.5 mg into the skin once a week. 2 mL 0   No current facility-administered medications on file prior to visit.    Allergies  Allergen Reactions   Sulfa Antibiotics     Unknown    Past Medical History:  Diagnosis Date   Diabetes mellitus without complication (HCC)    GERD (gastroesophageal reflux disease)    Hyperlipidemia    Hypertension     Past Surgical History:  Procedure Laterality Date   HERNIA REPAIR     TONSILLECTOMY     WOUND EXPLORATION  10/01/2020   Procedure: ABDOMINAL WOUND EXPLORATION;  Surgeon: [name], MD;  Location: [facility];  Service: General;;     Family History  Problem Relation Age of Onset   Hyperlipidemia Father    Heart disease Father    Diabetes Brother    Hypertension Brother     Social History   Socioeconomic History   Marital status: Married    Spouse name: Not on file   Number of children: Not on file   Years of education: Not on file   Highest education level: Not on file  Occupational History   Not on file  Tobacco Use   Smoking status: Former    Packs/day: 1.00    Years: 20.00    Total pack years: 20.00    Types: Cigarettes    Quit date: 06/26/2021    Years since quitting: 1.4   Smokeless tobacco: Never  Substance and Sexual Activity   Alcohol use: Not Currently    Alcohol/week: 0.0 standard drinks of alcohol   Drug use: Never   Sexual activity: Yes  Other Topics Concern   Not on file  Social History Narrative   Right handed   Drinks caffeine   Lives with wife and son   Psychologist, sport and exercise    Social Determinants of Health   Financial Resource Strain: Not on file  Food Insecurity: Not on file  Transportation Needs: Not on file  Physical Activity: Not on file  Stress: Not on file  Social Connections: Not on  file  Intimate Partner Violence: Not on file   Review of Systems No N/V Able to eat No change in taste or smell No diarrhea    Objective:   Physical Exam Constitutional:      Appearance: Normal appearance.  Pulmonary:     Effort: Pulmonary effort is normal. No respiratory distress.  Neurological:     Mental Status: He is alert.            Assessment & Plan:

## 2022-12-06 NOTE — Telephone Encounter (Signed)
I will evaluate him at the virtual visit today

## 2022-12-06 NOTE — Telephone Encounter (Signed)
Appreciate Dr. Vassie Moselle evaluation

## 2022-12-15 ENCOUNTER — Encounter: Payer: Self-pay | Admitting: Nurse Practitioner

## 2022-12-15 ENCOUNTER — Other Ambulatory Visit: Payer: Self-pay | Admitting: Nurse Practitioner

## 2022-12-15 DIAGNOSIS — E119 Type 2 diabetes mellitus without complications: Secondary | ICD-10-CM

## 2022-12-15 MED ORDER — TIRZEPATIDE 5 MG/0.5ML ~~LOC~~ SOAJ
5.0000 mg | SUBCUTANEOUS | 0 refills | Status: DC
  Filled 2022-12-15: qty 2, 28d supply, fill #0
  Filled 2023-01-08: qty 2, 28d supply, fill #1
  Filled 2023-01-31: qty 2, 28d supply, fill #2

## 2022-12-16 ENCOUNTER — Other Ambulatory Visit (HOSPITAL_COMMUNITY): Payer: Self-pay

## 2022-12-16 ENCOUNTER — Other Ambulatory Visit: Payer: Self-pay

## 2023-01-05 ENCOUNTER — Other Ambulatory Visit (HOSPITAL_COMMUNITY): Payer: Self-pay

## 2023-01-08 ENCOUNTER — Other Ambulatory Visit: Payer: Self-pay

## 2023-01-08 ENCOUNTER — Other Ambulatory Visit: Payer: Self-pay | Admitting: Nurse Practitioner

## 2023-01-10 ENCOUNTER — Other Ambulatory Visit (HOSPITAL_COMMUNITY): Payer: Self-pay

## 2023-01-10 ENCOUNTER — Other Ambulatory Visit: Payer: Self-pay

## 2023-01-10 MED ORDER — PANTOPRAZOLE SODIUM 40 MG PO TBEC
40.0000 mg | DELAYED_RELEASE_TABLET | Freq: Every day | ORAL | 0 refills | Status: DC
  Filled 2023-01-10: qty 90, 90d supply, fill #0

## 2023-01-25 ENCOUNTER — Other Ambulatory Visit (HOSPITAL_COMMUNITY): Payer: Self-pay

## 2023-01-31 ENCOUNTER — Other Ambulatory Visit (HOSPITAL_COMMUNITY): Payer: Self-pay

## 2023-02-08 DIAGNOSIS — E119 Type 2 diabetes mellitus without complications: Secondary | ICD-10-CM | POA: Diagnosis not present

## 2023-02-08 DIAGNOSIS — H52223 Regular astigmatism, bilateral: Secondary | ICD-10-CM | POA: Diagnosis not present

## 2023-02-08 LAB — HM DIABETES EYE EXAM

## 2023-02-21 ENCOUNTER — Encounter: Payer: Self-pay | Admitting: Nurse Practitioner

## 2023-02-21 ENCOUNTER — Ambulatory Visit (INDEPENDENT_AMBULATORY_CARE_PROVIDER_SITE_OTHER): Payer: No Typology Code available for payment source | Admitting: Nurse Practitioner

## 2023-02-21 ENCOUNTER — Other Ambulatory Visit (HOSPITAL_COMMUNITY): Payer: Self-pay

## 2023-02-21 VITALS — BP 130/88 | HR 79 | Temp 98.6°F | Ht 71.0 in | Wt 205.0 lb

## 2023-02-21 DIAGNOSIS — Z8673 Personal history of transient ischemic attack (TIA), and cerebral infarction without residual deficits: Secondary | ICD-10-CM

## 2023-02-21 DIAGNOSIS — E7800 Pure hypercholesterolemia, unspecified: Secondary | ICD-10-CM | POA: Diagnosis not present

## 2023-02-21 DIAGNOSIS — Z125 Encounter for screening for malignant neoplasm of prostate: Secondary | ICD-10-CM

## 2023-02-21 DIAGNOSIS — I10 Essential (primary) hypertension: Secondary | ICD-10-CM | POA: Diagnosis not present

## 2023-02-21 DIAGNOSIS — Z5320 Procedure and treatment not carried out because of patient's decision for unspecified reasons: Secondary | ICD-10-CM

## 2023-02-21 DIAGNOSIS — E119 Type 2 diabetes mellitus without complications: Secondary | ICD-10-CM | POA: Diagnosis not present

## 2023-02-21 LAB — COMPREHENSIVE METABOLIC PANEL
ALT: 17 U/L (ref 0–53)
AST: 19 U/L (ref 0–37)
Albumin: 4.3 g/dL (ref 3.5–5.2)
Alkaline Phosphatase: 76 U/L (ref 39–117)
BUN: 11 mg/dL (ref 6–23)
CO2: 29 mEq/L (ref 19–32)
Calcium: 9.8 mg/dL (ref 8.4–10.5)
Chloride: 104 mEq/L (ref 96–112)
Creatinine, Ser: 0.85 mg/dL (ref 0.40–1.50)
GFR: 99 mL/min (ref >60.00)
Glucose, Bld: 115 mg/dL — ABNORMAL HIGH (ref 70–99)
Potassium: 4.7 mEq/L (ref 3.5–5.1)
Sodium: 141 mEq/L (ref 135–145)
Total Bilirubin: 0.6 mg/dL (ref 0.2–1.2)
Total Protein: 6.8 g/dL (ref 6.0–8.3)

## 2023-02-21 LAB — LIPID PANEL
Cholesterol: 122 mg/dL (ref 0–200)
HDL: 40.3 mg/dL (ref >39.00)
LDL Cholesterol: 71 mg/dL (ref 0–99)
NonHDL: 81.57
Total CHOL/HDL Ratio: 3
Triglycerides: 52 mg/dL (ref 0.0–149.0)
VLDL: 10.4 mg/dL (ref 0.0–40.0)

## 2023-02-21 LAB — POCT GLYCOSYLATED HEMOGLOBIN (HGB A1C): Hemoglobin A1C: 6 % — AB (ref 4.0–5.6)

## 2023-02-21 LAB — CBC
HCT: 46.3 % (ref 39.0–52.0)
Hemoglobin: 16 g/dL (ref 13.0–17.0)
MCHC: 34.6 g/dL (ref 30.0–36.0)
MCV: 87 fl (ref 78.0–100.0)
Platelets: 197 10*3/uL (ref 150.0–400.0)
RBC: 5.32 Mil/uL (ref 4.22–5.81)
RDW: 13.7 % (ref 11.5–15.5)
WBC: 8.5 10*3/uL (ref 4.0–10.5)

## 2023-02-21 LAB — PSA: PSA: 0.63 ng/mL (ref 0.10–4.00)

## 2023-02-21 MED ORDER — TIRZEPATIDE 7.5 MG/0.5ML ~~LOC~~ SOAJ
7.5000 mg | SUBCUTANEOUS | 1 refills | Status: DC
  Filled 2023-02-21: qty 6, 84d supply, fill #0
  Filled 2023-03-06: qty 2, 28d supply, fill #0
  Filled 2023-04-02 – 2023-04-07 (×2): qty 2, 28d supply, fill #1
  Filled 2023-04-30 – 2023-05-18 (×3): qty 2, 28d supply, fill #2
  Filled 2023-06-06 – 2023-06-12 (×2): qty 2, 28d supply, fill #3
  Filled 2023-07-03 – 2023-07-10 (×2): qty 2, 28d supply, fill #4
  Filled 2023-08-09: qty 2, 28d supply, fill #5

## 2023-02-21 NOTE — Assessment & Plan Note (Signed)
Patient currently maintained on lisinopril 20 mg daily.  Blood pressure within normal limits.  Patient's blood pressure within normal control he has lost some weight as of late continue

## 2023-02-21 NOTE — Assessment & Plan Note (Signed)
Patient is been on rosuvastatin 20 mg we will recheck lipids today since patient is fasting.

## 2023-02-21 NOTE — Assessment & Plan Note (Signed)
Did discuss with patient a colonoscopy, Cologuard, iFOB.  Did discuss with patient that by the time bowel cancers present themselves generally they are far advanced and have poor outcomes.  Patient refused either through the screening exams.

## 2023-02-21 NOTE — Assessment & Plan Note (Signed)
Patient currently on Mounjaro, metformin, Farxiga.  Patient had appreciable drop in A1c from 7.7% to 6.0%.  Patient has a little bit of weight loss with the medication but still considered overweight.  Patient tolerating it well and would like to lose additional weight we will increase Mounjaro to 7.'5mg'$  did warn the possibility of hypoglycemia patient does have supplies at home to check his blood glucose.  We did review signs and symptoms of hypoglycemia in office.

## 2023-02-21 NOTE — Progress Notes (Signed)
Established Patient Office Visit  Subjective   Patient ID: Marvin Tapia, male    DOB: 1971-12-12  Age: 52 y.o. MRN: [id number]  Chief Complaint  Patient presents with   Diabetes      DM2: Patient currently maintained on Farxiga, metformin, Tresiba but denies 5 mg. Patient is also on high intensity statin rosuvastatin and on ACE inhibitor. States that he is tolerating the mounjaro. States that he is burping some more at  Checking sugars infrequently 105-120 averages. States that this morning was 123 Low glucose: None High glucose: 130s  CVA: Patient had an acute CVA has been followed by neurology cardiology.  He has been cleared from neurology did undergo a CT calcium score which came back at 0.  HTN: Patient currently maintained on lisinopril 20 mg daily.  Tolerating medication well.   Colonoscopy: states that he does not want to do any screening     Review of Systems  Constitutional:  Negative for chills and fever.  Respiratory:  Negative for shortness of breath.   Cardiovascular:  Negative for chest pain.  Gastrointestinal:  Negative for abdominal pain, constipation, diarrhea, nausea and vomiting.  Neurological:  Negative for headaches.      Objective:     BP 130/88   Pulse 79   Temp 98.6 F (37 C) (Oral)   Ht '5\' 11"'$  (1.803 m)   Wt 205 lb (93 kg)   SpO2 99%   BMI 28.59 kg/m  BP Readings from Last 3 Encounters:  02/21/23 130/88  12/06/22 121/73  11/17/22 130/82   Wt Readings from Last 3 Encounters:  02/21/23 205 lb (93 kg)  11/17/22 218 lb (98.9 kg)  11/11/22 219 lb (99.3 kg)      Physical Exam Vitals and nursing note reviewed.  Constitutional:      Appearance: Normal appearance.  Cardiovascular:     Rate and Rhythm: Normal rate and regular rhythm.     Pulses:          Dorsalis pedis pulses are 2+ on the right side and 2+ on the left side.       Posterior tibial pulses are 2+ on the right side and 2+ on the left side.     Heart sounds:  Normal heart sounds.  Pulmonary:     Effort: Pulmonary effort is normal.     Breath sounds: Normal breath sounds.  Abdominal:     General: Bowel sounds are normal. There is no distension.     Palpations: There is no mass.     Tenderness: There is no abdominal tenderness.     Hernia: No hernia is present.  Musculoskeletal:     Right lower leg: No edema.     Left lower leg: No edema.  Feet:     Right foot:     Skin integrity: Skin integrity normal.     Left foot:     Skin integrity: Skin integrity normal.  Skin:    General: Skin is warm.  Neurological:     Mental Status: He is alert.      Results for orders placed or performed in visit on 02/21/23  POCT glycosylated hemoglobin (Hb A1C)  Result Value Ref Range   Hemoglobin A1C 6.0 (A) 4.0 - 5.6 %   HbA1c POC (<> result, manual entry)     HbA1c, POC (prediabetic range)     HbA1c, POC (controlled diabetic range)        The ASCVD Risk score (Arnett DK, et  al., 2019) failed to calculate for the following reasons:   The patient has a prior MI or stroke diagnosis    Assessment & Plan:   Problem List Items Addressed This Visit       Cardiovascular and Mediastinum   Essential hypertension    Patient currently maintained on lisinopril 20 mg daily.  Blood pressure within normal limits.  Patient's blood pressure within normal control he has lost some weight as of late continue      Relevant Orders   Lipid panel     Endocrine   Type 2 diabetes mellitus without complication, without long-term current use of insulin ([place]) - Primary    Patient currently on Mounjaro, metformin, Farxiga.  Patient had appreciable drop in A1c from 7.7% to 6.0%.  Patient has a little bit of weight loss with the medication but still considered overweight.  Patient tolerating it well and would like to lose additional weight we will increase Mounjaro to 7.'5mg'$  did warn the possibility of hypoglycemia patient does have supplies at home to check his blood  glucose.  We did review signs and symptoms of hypoglycemia in office.      Relevant Medications   tirzepatide (MOUNJARO) 7.5 MG/0.5ML Pen   Other Relevant Orders   POCT glycosylated hemoglobin (Hb A1C) (Completed)   CBC   Comprehensive metabolic panel   Lipid panel     Other   Pure hypercholesterolemia    Patient is been on rosuvastatin 20 mg we will recheck lipids today since patient is fasting.      Relevant Orders   Lipid panel   Colonoscopy refused    Did discuss with patient a colonoscopy, Cologuard, iFOB.  Did discuss with patient that by the time bowel cancers present themselves generally they are far advanced and have poor outcomes.  Patient refused either through the screening exams.      Other Visit Diagnoses     History of CVA (cerebrovascular accident)       Relevant Orders   Lipid panel   Screening for prostate cancer       Relevant Orders   PSA       Return in about 4 months (around 06/22/2023) for DM recheck.    Romilda Garret, NP

## 2023-02-21 NOTE — Patient Instructions (Signed)
Nice to see you today I will be in touch with the labs once I have them Finish the '5mg'$  Darcel Bayley that you have I have sent in the updated dose of 7.'5mg'$  Follow up with me in 4 months, sooner if you need me

## 2023-03-06 ENCOUNTER — Other Ambulatory Visit: Payer: Self-pay | Admitting: Nurse Practitioner

## 2023-03-07 ENCOUNTER — Other Ambulatory Visit: Payer: Self-pay

## 2023-03-07 MED ORDER — METFORMIN HCL 1000 MG PO TABS
1000.0000 mg | ORAL_TABLET | Freq: Two times a day (BID) | ORAL | 3 refills | Status: DC
  Filled 2023-03-07: qty 180, 90d supply, fill #0

## 2023-03-08 NOTE — Progress Notes (Unsigned)
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 Hypertension Hyperlipidemia Type 2 diabetes mellitus   Continue stroke workup: Reorder 30 day cardiac event monitor - if he has not received a call to schedule in one week, he was advised to contact me.  As stroke is highly suspicious of possible cardioembolic event, consider ILR if event monitor negative.   Secondary stroke prevention as managed by PCP: Aspirin to '81mg'$  daily Rosuvastatin.  LDL goal less than 70 Normotensive blood pressure Hgb A1c goal less than 7 Follow up 6 months.       Subjective:  Marvin Tapia is a 52 year old right-handed male with DM II, HTN, and HLD and former cigarette smoker who follows up for stroke.  UPDATE: Current medications:  ASA '81mg'$ , Farxiga, tirzepatide, metformin, lisinopril, rosuvastatin  CTA Head on 11/10/2022 personally reviewed revealed no LVO or hemodynamically significant stenosis.  Referred to Dr. Claiborne Billings of cardiology.  With TTE negative for shunt, TEE was not felt to be indicated.  Coronary calcium score was 0.  We ordered a 30 day cardiac event monitor but nobody contacted him.  Repeat lipid panel from 2/26 revealed LDL of 71 with total cholesterol 122, TG 52 and HDL 40.30.  Feeling well.   HISTORY: On 10/15/2022, he was at work when he developed sudden onset of right arm numbness and decreased grip strength and hand locked up, slight right leg weakness as well as slight slurred speech.  Symptoms lasted several hours.  He went to [facility] ED.  CT head showed no acute abnormality.  Due to prolonged wait time in the CT, he left.  MRI of head on 10/19/2022 demonstrated small early subacute infarcts in the left frontal lobe involving the pre and postcentral gyri and middle frontal gyrus.  Started ASA.  Lovastatin was changed to rosuvastatin  Carotid ultrasound on 10/20/2022  revealed 1-39% ICA stenosis bilaterally and antegrade flow of vertebral arteries bilaterally.  2D echocardiogram on 10/29/2022 showed LVEF 60-65% without valvulopathy or atrial level shunt.   Since the stroke he continues to have right sided numbness.  He also feels lightheaded at times.    Previous labs revealed Hgb A1c 7.7 on 08/09/2022 and LDL 111 with TG 174 on 04/23/2022.  PAST MEDICAL HISTORY: Past Medical History:  Diagnosis Date   Diabetes mellitus without complication (HCC)    GERD (gastroesophageal reflux disease)    Hyperlipidemia    Hypertension     MEDICATIONS: Current Outpatient Medications on File Prior to Visit  Medication Sig Dispense Refill   ASPIRIN 81 PO Take by mouth. 1 tablet once daily     benzonatate (TESSALON) 200 MG capsule Take 1 capsule (200 mg total) by mouth 3 (three) times daily as needed for cough. 60 capsule 0   dapagliflozin propanediol (FARXIGA) 10 MG TABS tablet Take 1 tablet (10 mg total) by mouth daily before breakfast. 90 tablet 1   glucose blood (FREESTYLE LITE) test strip Use as directed in the morning and at bedtime. 200 each 12   lisinopril (ZESTRIL) 20 MG tablet Take 1 tablet by mouth daily. 90 tablet 1   metFORMIN (GLUCOPHAGE) 1000 MG tablet Take 1 tablet (1,000 mg total) by mouth 2 (two) times daily. 180 tablet 3   pantoprazole (PROTONIX) 40 MG tablet Take 1 tablet (40 mg total) by mouth daily. 90 tablet 0   rosuvastatin (CRESTOR) 20 MG tablet  Take 1 tablet (20 mg total) by mouth daily. 90 tablet 2   tirzepatide (MOUNJARO) 7.5 MG/0.5ML Pen Inject 7.5 mg into the skin once a week. 6 mL 1   No current facility-administered medications on file prior to visit.    ALLERGIES: Allergies  Allergen Reactions   Sulfa Antibiotics     Unknown    FAMILY HISTORY: Family History  Problem Relation Age of Onset   Hyperlipidemia Father    Heart disease Father    Diabetes Brother    Hypertension Brother       Objective:  Blood pressure 124/82,  pulse 86, height '5\' 11"'$  (1.803 m), weight 203 lb 6.4 oz (92.3 kg), SpO2 98 %. General: No acute distress.  Patient appears well-groomed.   Head:  Normocephalic/atraumatic Eyes:  Fundi examined but not visualized Neck: supple, no paraspinal tenderness, full range of motion Heart:  Regular rate and rhythm Lungs:  Clear to auscultation bilaterally Back: No paraspinal tenderness Neurological Exam: alert and oriented to person, place, and time.  Speech fluent and not dysarthric, language intact.  CN II-XII intact. Bulk and tone normal, muscle strength 5/5 throughout.  Sensation to light touch intact.  Deep tendon reflexes 2+ throughout, toes downgoing.  Finger to nose testing intact.  Gait normal, Romberg negative.   Metta Clines, DO  CC: Karl Ito, NP

## 2023-03-09 ENCOUNTER — Other Ambulatory Visit: Payer: Self-pay | Admitting: Neurology

## 2023-03-09 ENCOUNTER — Encounter: Payer: Self-pay | Admitting: Neurology

## 2023-03-09 ENCOUNTER — Ambulatory Visit: Payer: 59 | Admitting: Neurology

## 2023-03-09 VITALS — BP 124/82 | HR 86 | Ht 71.0 in | Wt 203.4 lb

## 2023-03-09 DIAGNOSIS — E7800 Pure hypercholesterolemia, unspecified: Secondary | ICD-10-CM

## 2023-03-09 DIAGNOSIS — I63412 Cerebral infarction due to embolism of left middle cerebral artery: Secondary | ICD-10-CM | POA: Diagnosis not present

## 2023-03-09 DIAGNOSIS — I10 Essential (primary) hypertension: Secondary | ICD-10-CM

## 2023-03-09 DIAGNOSIS — R42 Dizziness and giddiness: Secondary | ICD-10-CM

## 2023-03-09 DIAGNOSIS — E119 Type 2 diabetes mellitus without complications: Secondary | ICD-10-CM

## 2023-03-09 NOTE — Patient Instructions (Signed)
Continue current medications  Will reorder 30 day heart monitor. If you don't hear from anyone in one week, contact me

## 2023-03-10 ENCOUNTER — Other Ambulatory Visit: Payer: Self-pay

## 2023-03-10 ENCOUNTER — Other Ambulatory Visit (HOSPITAL_COMMUNITY): Payer: Self-pay

## 2023-03-14 ENCOUNTER — Ambulatory Visit: Payer: 59 | Attending: Neurology

## 2023-03-14 DIAGNOSIS — I63412 Cerebral infarction due to embolism of left middle cerebral artery: Secondary | ICD-10-CM

## 2023-03-14 DIAGNOSIS — R42 Dizziness and giddiness: Secondary | ICD-10-CM

## 2023-03-14 DIAGNOSIS — I10 Essential (primary) hypertension: Secondary | ICD-10-CM

## 2023-03-15 DIAGNOSIS — I63412 Cerebral infarction due to embolism of left middle cerebral artery: Secondary | ICD-10-CM | POA: Diagnosis not present

## 2023-03-15 DIAGNOSIS — R42 Dizziness and giddiness: Secondary | ICD-10-CM | POA: Diagnosis not present

## 2023-04-04 ENCOUNTER — Ambulatory Visit: Payer: 59 | Attending: Cardiovascular Disease | Admitting: Cardiovascular Disease

## 2023-04-04 ENCOUNTER — Encounter: Payer: Self-pay | Admitting: Cardiovascular Disease

## 2023-04-04 ENCOUNTER — Other Ambulatory Visit (HOSPITAL_COMMUNITY): Payer: Self-pay

## 2023-04-04 DIAGNOSIS — E119 Type 2 diabetes mellitus without complications: Secondary | ICD-10-CM | POA: Diagnosis not present

## 2023-04-04 DIAGNOSIS — K219 Gastro-esophageal reflux disease without esophagitis: Secondary | ICD-10-CM

## 2023-04-04 DIAGNOSIS — E7800 Pure hypercholesterolemia, unspecified: Secondary | ICD-10-CM | POA: Diagnosis not present

## 2023-04-04 DIAGNOSIS — I639 Cerebral infarction, unspecified: Secondary | ICD-10-CM | POA: Diagnosis not present

## 2023-04-04 DIAGNOSIS — I10 Essential (primary) hypertension: Secondary | ICD-10-CM | POA: Diagnosis not present

## 2023-04-04 NOTE — Progress Notes (Signed)
Cardiology Office Note    Date:  04/09/2023   ID:  Marvin Tapia, DOB 11/12/1971, MRN [id number]  PCP:  Eden Emmsable, James M, NP  Cardiologist:  Nicki Guadalajarahomas Wiley Magan, MD   5 month F/U cardiology evaluation initially referred by Dr. Shon MilletAdam Jaffe   History of Present Illness:  Marvin Tapia is a 52 y.o. male who is followed by  Mordecai MaesJames Cable, NP for primary care.  I saw him for my initial evaluation on November 11, 2022 when he was referred by Dr. Shon MilletAdam Jaffe following a recent stroke.  He presents for 2039-month cardiology follow-up evaluation.   Marvin Tapia has a history of hypertension for at least 10 to 12 years, diabetes mellitus for 3 to 4 years, and admits to hyperlipidemia for at least 10 to 12 years.  He has remote tobacco history.  On October 15, 2022 while at work he developed sudden onset of right arm numbness and decreased grip strength.  His hand locked up and he noted slight right leg weakness and slight slurred speech.  Symptoms lasted for several hours.  [facility] head CT did not show acute abnormality.  He ultimately left the ER.  An MRI of his head on October 19, 2022 demonstrated small early subacute infarcts in the frontal lobes involving the pre and postcentral gyri and middle frontal gyrus.  He was started on aspirin.  He had been on low-dose rosuvastatin and this was changed to rosuvastatin 10 mg.  A carotid ultrasound on October 20, 2022 demonstrated 1 to 39% bilateral ICA stenoses with antegrade flow in the vertebral arteries bilaterally.  He underwent a 2D echo Doppler study on October 29, 2022 which showed normal LV function with EF at 60 to 65%.  There was no regional wall motion abnormalities.  Right ventricular size was normal.  Valves were essentially normal.  There was no evidence for an atrial level shunt by color-flow Doppler.  He was  evaluated by Dr. Shon MilletAdam Jaffe of neurology.  During his evaluation on November 01, 2022 he continued to have some residual right-sided  numbness.  I saw him for my initial cardiology evaluation on November 11, 2022.  At that time, he had d normalization and return of right arm/hand function.  Marvin Tapia denies any chest tightness.  He denies any exertional dyspnea.  He is unaware of cardiac arrhythmias and had no history of atrial fibrillation.  He denies presyncope or syncope.  He is scheduled to wear a 30-day cardiac event monitor which has not yet initiated.  On April 23, 2022 total cholesterol was 189, triglycerides 175, HDL 42 and LDL 111.  He initially had been on lovastatin which was changed to low-dose rosuvastatin.  I recommended further titration of rosuvastatin to at least 20 mg with LDL goal less than 70.  I scheduled him for coronary calcium score to assess coronary calcification.  In several months I recommended follow-up laboratory.  His blood pressure was stable on lisinopril 20 mg daily.  Since I saw him, he has felt well.  He underwent a CT cardiac score evaluation on November 23, 2022.  This was excellent with a calcium score of 0.  CT over read did not show any acute extracardiac findings although he did have mild aortic atherosclerosis.  He was wearing a heart monitor with Dr. Everlena CooperJaffe.  Follow-up laboratory on February 21, 2023 showed improved lipid studies with total cholesterol 122, HDL 40.3, LDL 71, and triglycerides at  52.  He was now on Mounjaro weekly injection and has lost 15 pounds.  He continues to be on Farxiga 10 mg and metformin for his diabetes.  He is on lisinopril 20 mg for hypertension, baby aspirin, and rosuvastatin 20 mg for hyperlipidemia.  He denies chest pain or shortness of breath.  He is unaware of any rhythm abnormalities.  He presents for follow-up evaluation.   Past Medical History:  Diagnosis Date   Diabetes mellitus without complication    GERD (gastroesophageal reflux disease)    Hyperlipidemia    Hypertension     Past Surgical History:  Procedure Laterality Date   HERNIA REPAIR      TONSILLECTOMY     WOUND EXPLORATION  10/01/2020   Procedure: ABDOMINAL WOUND EXPLORATION;  Surgeon: [name], MD;  Location: [facility];  Service: General;;    Current Medications: Outpatient Medications Prior to Visit  Medication Sig Dispense Refill   ASPIRIN 81 PO Take by mouth. 1 tablet once daily     dapagliflozin propanediol (FARXIGA) 10 MG TABS tablet Take 1 tablet (10 mg total) by mouth daily before breakfast. 90 tablet 1   glucose blood (FREESTYLE LITE) test strip Use as directed in the morning and at bedtime. 200 each 12   lisinopril (ZESTRIL) 20 MG tablet Take 1 tablet by mouth daily. 90 tablet 1   metFORMIN (GLUCOPHAGE) 1000 MG tablet Take 1 tablet (1,000 mg total) by mouth 2 (two) times daily. 180 tablet 3   pantoprazole (PROTONIX) 40 MG tablet Take 1 tablet (40 mg total) by mouth daily. 90 tablet 0   rosuvastatin (CRESTOR) 20 MG tablet Take 1 tablet (20 mg total) by mouth daily. 90 tablet 2   tirzepatide (MOUNJARO) 7.5 MG/0.5ML Pen Inject 7.5 mg into the skin once a week. 6 mL 1   No facility-administered medications prior to visit.     Allergies:   Sulfa antibiotics   Social History   Socioeconomic History   Marital status: Married    Spouse name: Not on file   Number of children: Not on file   Years of education: Not on file   Highest education level: Not on file  Occupational History   Not on file  Tobacco Use   Smoking status: Former    Packs/day: 1.00    Years: 20.00    Additional pack years: 0.00    Total pack years: 20.00    Types: Cigarettes    Quit date: 06/26/2021    Years since quitting: 1.7   Smokeless tobacco: Never  Substance and Sexual Activity   Alcohol use: Not Currently    Alcohol/week: 0.0 standard drinks of alcohol   Drug use: Never   Sexual activity: Yes  Other Topics Concern   Not on file  Social History Narrative   Right handed   Drinks caffeine   Lives with wife and son   Psychologist, sport and exercise    Social Determinants of Health    Financial Resource Strain: Not on file  Food Insecurity: Not on file  Transportation Needs: Not on file  Physical Activity: Not on file  Stress: Not on file  Social Connections: Not on file    Social history is notable that he was born in [place].  He has been living in [place] since 2005.  He is married for 33 years.  He has 2 children.  He is a Hospital doctor for [employer].  There is a prior 25-year tobacco history.  At one time he had  quit for 9 years then resumed smoking but has been quit for the past year.  He does not exercise outside work but admits that he is on his feet all day at work.   Family History:  The patient's family history includes Diabetes in his brother; Heart disease in his father; Hyperlipidemia in his father; Hypertension in his brother.  His mother is living at age 574.  Father died of a heart attack at age 54.  Maternal grandfather died of a heart attack at age 54.  He has 1 living brother age 54 with Bell's palsy.  He has 2 living sisters ages 5963 and 6160.  His children are 7531 and 519 years old.  ROS General: Negative; No fevers, chills, or night sweats;  HEENT: Negative; No changes in vision or hearing, sinus congestion, difficulty swallowing Pulmonary: Negative; No cough, wheezing, shortness of breath, hemoptysis Cardiovascular: Negative; No chest pain, presyncope, syncope, palpitations GI: Negative; No nausea, vomiting, diarrhea, or abdominal pain GU: Negative; No dysuria, hematuria, or difficulty voiding Musculoskeletal: Negative; no myalgias, joint pain, or weakness Hematologic/Oncology: Negative; no easy bruising, bleeding Endocrine: Negative; no heat/cold intolerance; no diabetes Neuro: Negative; no changes in balance, headaches Skin: Negative; No rashes or skin lesions Psychiatric: Negative; No behavioral problems, depression Sleep: He admits to snoring but he feels he sleeps well.  He denies any daytime sleepiness, hypersomnolence, bruxism, restless  legs, hypnogognic hallucinations, no cataplexy Other comprehensive 14 point system review is negative.   PHYSICAL EXAM:   VS:  BP 124/80   Pulse 78   Ht 5\' 11"  (1.803 m)   Wt 205 lb 9.6 oz (93.3 kg)   SpO2 99%   BMI 28.68 kg/m     Repeat blood pressure by me was 116/76  Wt Readings from Last 3 Encounters:  04/04/23 205 lb 9.6 oz (93.3 kg)  03/09/23 203 lb 6.4 oz (92.3 kg)  02/21/23 205 lb (93 kg)    General: Alert, oriented, no distress.  Skin: normal turgor, no rashes, warm and dry HEENT: Normocephalic, atraumatic. Pupils equal round and reactive to light; sclera anicteric; extraocular muscles intact;  Nose without nasal septal hypertrophy Mouth/Parynx benign; Mallinpatti scale 3 Neck: No JVD, no carotid bruits; normal carotid upstroke Lungs: clear to ausculatation and percussion; no wheezing or rales Chest wall: without tenderness to palpitation Heart: PMI not displaced, RRR, s1 s2 normal, 1/6 systolic murmur, no diastolic murmur, no rubs, gallops, thrills, or heaves Abdomen: soft, nontender; no hepatosplenomehaly, BS+; abdominal aorta nontender and not dilated by palpation. Back: no CVA tenderness Pulses 2+ Musculoskeletal: full range of motion, normal strength, no joint deformities Extremities: no clubbing cyanosis or edema, Homan's sign negative  Neurologic: grossly nonfocal; Cranial nerves grossly wnl Psychologic: Normal mood and affect   Studies/Labs Reviewed:   April 04, 2023 ECG (independently read by me):  NSR at 78; mild sinus arrythmia  November 11, 2022 ECG (independently read by me): NSR at 73, No significant STT changes; no ectopy, normal intervals  Recent Labs:    Latest Ref Rng & Units 02/21/2023    8:29 AM 10/15/2022   10:21 PM 10/15/2022    9:33 PM  BMP  Glucose 70 - 99 mg/dL 161115  096202  045203   BUN 6 - 23 mg/dL 11  18  14    Creatinine 0.40 - 1.50 mg/dL 4.090.85  8.110.90  9.140.97   Sodium 135 - 145 mEq/L 141  141  140   Potassium 3.5 - 5.1 mEq/L 4.7  3.8  3.8    Chloride 96 - 112 mEq/L 104  102  105   CO2 19 - 32 mEq/L 29   26   Calcium 8.4 - 10.5 mg/dL 9.8   9.6         Latest Ref Rng & Units 02/21/2023    8:29 AM 10/15/2022    9:33 PM 08/09/2022    8:54 AM  Hepatic Function  Total Protein 6.0 - 8.3 g/dL 6.8  6.9  7.1   Albumin 3.5 - 5.2 g/dL 4.3  4.1  4.3   AST 0 - 37 U/L 19  28  22    ALT 0 - 53 U/L 17  25  23    Alk Phosphatase 39 - 117 U/L 76  71  78   Total Bilirubin 0.2 - 1.2 mg/dL 0.6  0.6  0.5        Latest Ref Rng & Units 02/21/2023    8:29 AM 10/15/2022   10:21 PM 10/15/2022    9:33 PM  CBC  WBC 4.0 - 10.5 K/uL 8.5   12.5   Hemoglobin 13.0 - 17.0 g/dL 16.1  09.6  04.5   Hematocrit 39.0 - 52.0 % 46.3  44.0  44.9   Platelets 150.0 - 400.0 K/uL 197.0   243    Lab Results  Component Value Date   MCV 87.0 02/21/2023   MCV 86.8 10/15/2022   MCV 87.7 04/23/2022   Lab Results  Component Value Date   TSH 2.16 08/14/2021   Lab Results  Component Value Date   HGBA1C 6.0 (A) 02/21/2023     BNP No results found for: "BNP"  ProBNP No results found for: "PROBNP"   Lipid Panel     Component Value Date/Time   CHOL 122 02/21/2023 0829   TRIG 52.0 02/21/2023 0829   HDL 40.30 02/21/2023 0829   CHOLHDL 3 02/21/2023 0829   VLDL 10.4 02/21/2023 0829   LDLCALC 71 02/21/2023 0829   LDLDIRECT 123.0 08/14/2021 1035     RADIOLOGY: No results found.   Additional studies/ records that were reviewed today include:  I reviewed the records from from [facility] ER on October 20.  Head CT, MRI of head, carotid ultrasound, and 2D echo Doppler studies were reviewed as were notes of Dr. Milinda Antis, Loraine Grip, NP, and Dr. Shon Millet.  ECHO: 10/29/2022 IMPRESSIONS   1. Left ventricular ejection fraction, by estimation, is 60 to 65%. The  left ventricle has normal function. The left ventricle has no regional  wall motion abnormalities. Left ventricular diastolic parameters are  indeterminate.   2. Right ventricular systolic function  is normal. The right ventricular  size is normal. Tricuspid regurgitation signal is inadequate for assessing  PA pressure.   3. The mitral valve is normal in structure. No evidence of mitral valve  regurgitation.   4. The aortic valve is tricuspid. Aortic valve regurgitation is not  visualized.   5. The inferior vena cava is normal in size with greater than 50%  respiratory variability, suggesting right atrial pressure of 3 mmHg.   FINDINGS   Left Ventricle: Left ventricular ejection fraction, by estimation, is 60  to 65%. The left ventricle has normal function. The left ventricle has no  regional wall motion abnormalities. The left ventricular internal cavity  size was normal in size. There is   no left ventricular hypertrophy. Left ventricular diastolic parameters  are indeterminate.   Right Ventricle: The right ventricular size is normal. Right ventricular  systolic function is  normal. Tricuspid regurgitation signal is inadequate  for assessing PA pressure.   Left Atrium: Left atrial size was normal in size.   Right Atrium: Right atrial size was normal in size.   Pericardium: There is no evidence of pericardial effusion.   Mitral Valve: The mitral valve is normal in structure. No evidence of  mitral valve regurgitation.   Tricuspid Valve: The tricuspid valve is grossly normal. Tricuspid valve  regurgitation is not demonstrated.   Aortic Valve: The aortic valve is tricuspid. Aortic valve regurgitation is  not visualized.   Pulmonic Valve: The pulmonic valve was normal in structure. Pulmonic valve  regurgitation is not visualized.   Aorta: The aortic root and ascending aorta are structurally normal, with  no evidence of dilitation.   Venous: The inferior vena cava is normal in size with greater than 50%  respiratory variability, suggesting right atrial pressure of 3 mmHg.   IAS/Shunts: No atrial level shunt detected by color flow Doppler.     LEFT VENTRICLE  PLAX 2D   LVIDd:         4.30 cm   Diastology  LVIDs:         2.40 cm   LV e' medial:    6.20 cm/s  LV PW:         0.90 cm   LV E/e' medial:  13.2  LV IVS:        1.00 cm   LV e' lateral:   7.07 cm/s  LVOT diam:     2.10 cm   LV E/e' lateral: 11.6  LV SV:         62  LV SV Index:   29  LVOT Area:     3.46 cm                             3D Volume EF:                           3D EF:        68 %                           LV EDV:       95 ml                           LV ESV:       30 ml                           LV SV:        65 ml   RIGHT VENTRICLE  RV Basal diam:  3.70 cm  RV S prime:     15.20 cm/s  TAPSE (M-mode): 1.7 cm   LEFT ATRIUM             Index        RIGHT ATRIUM           Index  LA diam:        4.30 cm 2.00 cm/m   RA Area:     17.90 cm  LA Vol (A2C):   63.5 ml 29.58 ml/m  RA Volume:   49.00 ml  22.83 ml/m  LA Vol (A4C):   57.0 ml 26.55 ml/m  LA Biplane Vol: 61.2 ml 28.51 ml/m  AORTIC VALVE  LVOT Vmax:   108.00 cm/s  LVOT Vmean:  67.900 cm/s  LVOT VTI:    0.178 m    AORTA  Ao Root diam: 3.00 cm  Ao Asc diam:  3.60 cm   MITRAL VALVE  MV Area (PHT): cm         SHUNTS  MV Decel Time: 197 msec    Systemic VTI:  0.18 m  MV E velocity: 81.90 cm/s  Systemic Diam: 2.10 cm  MV A velocity: 79.30 cm/s  MV E/A ratio:  1.03   Calcium Score: 11/28/ 2024 FINDINGS: Non-cardiac: See separate report from [facility].   Ascending Aorta: Normal size   Pericardium: Normal   Coronary arteries: Normal origin of left and right coronary arteries. Distribution of arterial calcifications if present, as noted below;   LM 0   LAD 0   LCx 0   RCA 0   Total 0   IMPRESSION AND RECOMMENDATION: 1. Normal coronary calcium score of 0.  Patient is low risk.   2. CAC 0, CAC-DRS A0.   3. Continue heart healthy lifestyle and risk factor modification.    IMPRESSION: 1. No acute extracardiac findings. 2. Mild aortic Atherosclerosis (ICD10-I70.0).    ASSESSMENT:     1. Essential hypertension   2. Pure hypercholesterolemia   3. Cerebrovascular accident (CVA), unspecified mechanism   4. Type 2 diabetes mellitus without complication, without long-term current use of insulin   5. Gastroesophageal reflux disease without esophagitis      PLAN:  Mr. Jeannett Senior Vanhise is a 52 year old gentleman who has cardiovascular comorbidities including hyperlipidemia for 10 to 12 years, hypertension for 10 to 12 years, diabetes mellitus for 3 to 4 years, and prior tobacco use.  He developed sudden onset of right arm numbness with decreased grip strength, right hand locking up, slight right leg weakness and slight slurred speech on October 15, 2022.  He ultimately was found to have small early subacute infarcts in the left frontal lobe involving the pre and postcentral gyri and middle frontal gyrus.  He had persistent symptoms following the acute event but ultimately he believes now he has had normalization of function and can move his hand and fingers with full motion without difficulty.  He was found to have mild carotid plaque in 1 to 39% on carotid ultrasound and had antegrade flow of the vertebral arteries bilaterally.  A 2D echo Doppler study on October 29, 2022 showed vigorous LV function with EF at 60 to 65%.  He did not have a wall motion abnormality and had normal RV function.  Valves were essentially normal.  He had normal biatrial size.  There was no evidence for a PFO or any shunt detected by color-flow Doppler.  He is unaware of any history of atrial fibrillation.  He is now on baby aspirin 81 mg.  Recent laboratory was reviewed reviewed.  On April 23, 2022 total cholesterol was 189 triglycerides 175 HDL 42, VLDL 35 and LDL 111.  He was on low-dose lovastatin which was changed to rosuvastatin 10 mg.  At my initial evaluation I recommended more aggressive lipid-lowering therapy and increase rosuvastatin to 20 mg daily.  Subsequent laboratory on February 21, 2023 showed  improved lipid studies with total cholesterol now 122, triglycerides 52, HDL 40, and LDL 71.  Chemistry revealed glucose 115.  Creatinine 0.85.  LFTs normal.  His blood pressure today continues to be stable on lisinopril 20 mg daily.  He is on [place] in addition  to metformin for his diabetes and recently Greggory Keen was initiated which he takes 7.5 mg weekly.  He has lost 15 pounds since [place] initiation.  He is currently wearing an event monitor.  ECG today is stable.  Neurologically he has recovered.  He continues to be on pantoprazole for GERD.  He continues to be on 81 mg aspirin.  As long as he is stable I will see him in 1 year for reevaluation or sooner as needed.   Medication Adjustments/Labs and Tests Ordered: Current medicines are reviewed at length with the patient today.  Concerns regarding medicines are outlined above.  Medication changes, Labs and Tests ordered today are listed in the Patient Instructions below. Patient Instructions  Medication Instructions:  Your physician recommends that you continue on your current medications as directed. Please refer to the Current Medication list given to you today.  *If you need a refill on your cardiac medications before your next appointment, please call your pharmacy*  Follow-Up: At [facility], you and your health needs are our priority.  As part of our continuing mission to provide you with exceptional heart care, we have created designated Provider Care Teams.  These Care Teams include your primary Cardiologist (physician) and Advanced Practice Providers (APPs -  Physician Assistants and Nurse Practitioners) who all work together to provide you with the care you need, when you need it.  We recommend signing up for the patient portal called "MyChart".  Sign up information is provided on this After Visit Summary.  MyChart is used to connect with patients for Virtual Visits (Telemedicine).  Patients are able to view lab/test results,  encounter notes, upcoming appointments, etc.  Non-urgent messages can be sent to your provider as well.   To learn more about what you can do with MyChart, go to [email or web address].    Your next appointment:   12 month(s)  Provider:   Dr. Tresa Endo     Signed, Nicki Guadalajara, MD  04/09/2023 11:15 AM    [facility] [address], [address], [place], [place]  [address] Phone: [phone]

## 2023-04-04 NOTE — Patient Instructions (Signed)
Medication Instructions:  Your physician recommends that you continue on your current medications as directed. Please refer to the Current Medication list given to you today.  *If you need a refill on your cardiac medications before your next appointment, please call your pharmacy*  Follow-Up: At [facility], you and your health needs are our priority.  As part of our continuing mission to provide you with exceptional heart care, we have created designated Provider Care Teams.  These Care Teams include your primary Cardiologist (physician) and Advanced Practice Providers (APPs -  Physician Assistants and Nurse Practitioners) who all work together to provide you with the care you need, when you need it.  We recommend signing up for the patient portal called "MyChart".  Sign up information is provided on this After Visit Summary.  MyChart is used to connect with patients for Virtual Visits (Telemedicine).  Patients are able to view lab/test results, encounter notes, upcoming appointments, etc.  Non-urgent messages can be sent to your provider as well.   To learn more about what you can do with MyChart, go to [email or web address].    Your next appointment:   12 month(s)  Provider:   Dr. Kelly   

## 2023-04-07 ENCOUNTER — Other Ambulatory Visit (HOSPITAL_COMMUNITY): Payer: Self-pay

## 2023-04-09 ENCOUNTER — Encounter: Payer: Self-pay | Admitting: Cardiovascular Disease

## 2023-04-11 ENCOUNTER — Other Ambulatory Visit (HOSPITAL_COMMUNITY): Payer: Self-pay

## 2023-04-17 ENCOUNTER — Other Ambulatory Visit: Payer: Self-pay | Admitting: Nurse Practitioner

## 2023-04-18 ENCOUNTER — Other Ambulatory Visit (HOSPITAL_COMMUNITY): Payer: Self-pay

## 2023-04-18 ENCOUNTER — Other Ambulatory Visit: Payer: Self-pay

## 2023-04-18 MED ORDER — LISINOPRIL 20 MG PO TABS
20.0000 mg | ORAL_TABLET | Freq: Every day | ORAL | 1 refills | Status: DC
  Filled 2023-04-18: qty 90, 90d supply, fill #0
  Filled 2023-06-22 – 2023-06-29 (×2): qty 90, 90d supply, fill #1

## 2023-05-02 ENCOUNTER — Other Ambulatory Visit: Payer: Self-pay

## 2023-05-02 ENCOUNTER — Encounter: Payer: Self-pay | Admitting: Pharmacist

## 2023-05-06 ENCOUNTER — Other Ambulatory Visit: Payer: Self-pay

## 2023-05-09 ENCOUNTER — Other Ambulatory Visit (HOSPITAL_COMMUNITY): Payer: Self-pay

## 2023-05-16 ENCOUNTER — Other Ambulatory Visit: Payer: Self-pay

## 2023-05-16 ENCOUNTER — Other Ambulatory Visit (HOSPITAL_COMMUNITY): Payer: Self-pay

## 2023-05-18 ENCOUNTER — Other Ambulatory Visit (HOSPITAL_COMMUNITY): Payer: Self-pay

## 2023-05-19 ENCOUNTER — Other Ambulatory Visit (HOSPITAL_COMMUNITY): Payer: Self-pay

## 2023-05-26 ENCOUNTER — Telehealth: Payer: Self-pay

## 2023-05-26 NOTE — Telephone Encounter (Signed)
PA initiated via Covermymeds; KEY: [id number]. Awaiting determination.

## 2023-05-31 NOTE — Telephone Encounter (Signed)
PA approved.   The request has been approved. The authorization is effective from 05/26/2023 to 05/24/2024, as long as the member is enrolled in their current health plan. Authorization Expiration Date: 05/23/2024

## 2023-05-31 NOTE — Telephone Encounter (Signed)
Called pt and advised him of this information.

## 2023-05-31 NOTE — Progress Notes (Signed)
LMOVM for patient to call back.

## 2023-06-07 ENCOUNTER — Other Ambulatory Visit: Payer: Self-pay

## 2023-06-13 ENCOUNTER — Other Ambulatory Visit: Payer: Self-pay

## 2023-06-22 ENCOUNTER — Other Ambulatory Visit (HOSPITAL_COMMUNITY): Payer: Self-pay

## 2023-06-22 ENCOUNTER — Other Ambulatory Visit: Payer: Self-pay | Admitting: Nurse Practitioner

## 2023-06-22 ENCOUNTER — Other Ambulatory Visit: Payer: Self-pay

## 2023-06-22 ENCOUNTER — Ambulatory Visit: Payer: No Typology Code available for payment source | Admitting: Nurse Practitioner

## 2023-06-22 ENCOUNTER — Encounter: Payer: Self-pay | Admitting: Nurse Practitioner

## 2023-06-22 VITALS — BP 110/68 | HR 86 | Temp 97.7°F | Ht 71.0 in | Wt 190.0 lb

## 2023-06-22 DIAGNOSIS — I10 Essential (primary) hypertension: Secondary | ICD-10-CM | POA: Diagnosis not present

## 2023-06-22 DIAGNOSIS — Z7985 Long-term (current) use of injectable non-insulin antidiabetic drugs: Secondary | ICD-10-CM | POA: Diagnosis not present

## 2023-06-22 DIAGNOSIS — E119 Type 2 diabetes mellitus without complications: Secondary | ICD-10-CM | POA: Diagnosis not present

## 2023-06-22 DIAGNOSIS — Z7984 Long term (current) use of oral hypoglycemic drugs: Secondary | ICD-10-CM

## 2023-06-22 LAB — POCT GLYCOSYLATED HEMOGLOBIN (HGB A1C): Hemoglobin A1C: 5.7 % — AB (ref 4.0–5.6)

## 2023-06-22 MED ORDER — PANTOPRAZOLE SODIUM 40 MG PO TBEC
40.0000 mg | DELAYED_RELEASE_TABLET | Freq: Every day | ORAL | 0 refills | Status: DC
  Filled 2023-06-29: qty 90, 90d supply, fill #0

## 2023-06-22 MED ORDER — DAPAGLIFLOZIN PROPANEDIOL 10 MG PO TABS
10.0000 mg | ORAL_TABLET | Freq: Every day | ORAL | 1 refills | Status: DC
  Filled 2023-06-22: qty 90, 90d supply, fill #0
  Filled 2023-10-05: qty 90, 90d supply, fill #1

## 2023-06-22 MED ORDER — METFORMIN HCL 1000 MG PO TABS: 1000.0000 mg | ORAL_TABLET | Freq: Every day | ORAL | 3 refills | Status: DC

## 2023-06-22 NOTE — Patient Instructions (Signed)
Nice to see you today Your A1C was 5.7%, that is fantastic. Keep up the good work  Follow up with me in 4 months for a sugar recheck, sooner if you need me

## 2023-06-22 NOTE — Assessment & Plan Note (Signed)
Patient currently maintained on metformin 1000 mg daily, Farxiga 10 mg daily, Mounjaro 7.5 mg weekly.  A1c 5.7%.  Patient denies any hypoglycemia.  We did review signs and symptoms and information given at discharge about the same.  We will continue medication as is did offer to back patient off of metformin he would like to continue right now as he is under great control.  If he starts developing hypoglycemia we will take him off metformin altogether.

## 2023-06-22 NOTE — Assessment & Plan Note (Signed)
Patient currently maintained on lisinopril 20 mg daily.  Blood pressure under great control.  Patient has lost weight did have a discussion about if he continues to lose weight blood pressure trending down to watch out for lightheadedness and dizziness.  Will continue lisinopril 20 mg as it is currently he does have ability to check blood pressure at home if needed.

## 2023-06-22 NOTE — Progress Notes (Signed)
Established Patient Office Visit  Subjective   Patient ID: Marvin Tapia, male    DOB: August 28, 1971  Age: 52 y.o. MRN: [id number]  Chief Complaint  Patient presents with   Diabetes    Not checking at home       DM2: patient is currently on mounjaro, metformin, and farxiga. He is not checking his blood sugar at home. Patient has lost 15 pounds. States that he is tolerating medications well. No hypoglycemia.  Patient states he has back down on metformin is only taking 1000 mg once daily.  HTN: currently on lisinopril 20 mg daily. Does have a cuff at home  and can check it if needed patient denies any lightheadedness or dizziness currently     Review of Systems  Constitutional:  Negative for chills and fever.  Respiratory:  Negative for shortness of breath.   Cardiovascular:  Negative for chest pain.  Gastrointestinal:  Negative for abdominal pain, constipation, diarrhea, nausea and vomiting.  Neurological:  Negative for dizziness and headaches.      Objective:     BP 110/68   Pulse 86   Temp 97.7 F (36.5 C) (Temporal)   Ht 5\' 11"  (1.803 m)   Wt 190 lb (86.2 kg)   BMI 26.50 kg/m  BP Readings from Last 3 Encounters:  06/22/23 110/68  04/04/23 124/80  03/09/23 124/82   Wt Readings from Last 3 Encounters:  06/22/23 190 lb (86.2 kg)  04/04/23 205 lb 9.6 oz (93.3 kg)  03/09/23 203 lb 6.4 oz (92.3 kg)      Physical Exam Vitals and nursing note reviewed.  Constitutional:      Appearance: Normal appearance.  Cardiovascular:     Rate and Rhythm: Normal rate and regular rhythm.     Pulses:          Dorsalis pedis pulses are 2+ on the right side and 2+ on the left side.       Posterior tibial pulses are 2+ on the right side and 2+ on the left side.     Heart sounds: Normal heart sounds.  Pulmonary:     Effort: Pulmonary effort is normal.     Breath sounds: Normal breath sounds.  Musculoskeletal:     Right lower leg: No edema.     Left lower leg: No edema.   Feet:     Right foot:     Skin integrity: Skin integrity normal.     Toenail Condition: Right toenails are abnormally thick. Fungal disease present.    Left foot:     Skin integrity: Skin integrity normal.     Toenail Condition: Left toenails are abnormally thick. Fungal disease present. Neurological:     Mental Status: He is alert.      Results for orders placed or performed in visit on 06/22/23  POCT glycosylated hemoglobin (Hb A1C)  Result Value Ref Range   Hemoglobin A1C 5.7 (A) 4.0 - 5.6 %   HbA1c POC (<> result, manual entry)     HbA1c, POC (prediabetic range)     HbA1c, POC (controlled diabetic range)        The ASCVD Risk score (Arnett DK, et al., 2019) failed to calculate for the following reasons:   The patient has a prior MI or stroke diagnosis    Assessment & Plan:   Problem List Items Addressed This Visit       Cardiovascular and Mediastinum   Essential hypertension    Patient currently maintained on lisinopril  20 mg daily.  Blood pressure under great control.  Patient has lost weight did have a discussion about if he continues to lose weight blood pressure trending down to watch out for lightheadedness and dizziness.  Will continue lisinopril 20 mg as it is currently he does have ability to check blood pressure at home if needed.        Endocrine   Type 2 diabetes mellitus without complication, without long-term current use of insulin (HCC) - Primary    Patient currently maintained on metformin 1000 mg daily, Farxiga 10 mg daily, Mounjaro 7.5 mg weekly.  A1c 5.7%.  Patient denies any hypoglycemia.  We did review signs and symptoms and information given at discharge about the same.  We will continue medication as is did offer to back patient off of metformin he would like to continue right now as he is under great control.  If he starts developing hypoglycemia we will take him off metformin altogether.      Relevant Medications   dapagliflozin propanediol  (FARXIGA) 10 MG TABS tablet   metFORMIN (GLUCOPHAGE) 1000 MG tablet   Other Relevant Orders   POCT glycosylated hemoglobin (Hb A1C) (Completed)    Return in about 4 months (around 10/22/2023) for DM recheck.    [name], NP

## 2023-06-29 ENCOUNTER — Other Ambulatory Visit: Payer: Self-pay

## 2023-07-04 ENCOUNTER — Other Ambulatory Visit: Payer: Self-pay

## 2023-07-11 ENCOUNTER — Other Ambulatory Visit: Payer: Self-pay

## 2023-07-13 ENCOUNTER — Other Ambulatory Visit (HOSPITAL_COMMUNITY): Payer: Self-pay

## 2023-07-14 ENCOUNTER — Other Ambulatory Visit (HOSPITAL_COMMUNITY): Payer: Self-pay

## 2023-08-10 ENCOUNTER — Other Ambulatory Visit (HOSPITAL_COMMUNITY): Payer: Self-pay

## 2023-08-18 ENCOUNTER — Ambulatory Visit (INDEPENDENT_AMBULATORY_CARE_PROVIDER_SITE_OTHER): Payer: No Typology Code available for payment source | Admitting: Nurse Practitioner

## 2023-08-18 VITALS — BP 110/72 | HR 83 | Temp 97.8°F | Ht 70.0 in | Wt 181.8 lb

## 2023-08-18 DIAGNOSIS — Z5320 Procedure and treatment not carried out because of patient's decision for unspecified reasons: Secondary | ICD-10-CM | POA: Diagnosis not present

## 2023-08-18 DIAGNOSIS — K219 Gastro-esophageal reflux disease without esophagitis: Secondary | ICD-10-CM

## 2023-08-18 DIAGNOSIS — E119 Type 2 diabetes mellitus without complications: Secondary | ICD-10-CM | POA: Diagnosis not present

## 2023-08-18 DIAGNOSIS — Z0000 Encounter for general adult medical examination without abnormal findings: Secondary | ICD-10-CM

## 2023-08-18 DIAGNOSIS — Z87891 Personal history of nicotine dependence: Secondary | ICD-10-CM | POA: Diagnosis not present

## 2023-08-18 DIAGNOSIS — E7800 Pure hypercholesterolemia, unspecified: Secondary | ICD-10-CM | POA: Diagnosis not present

## 2023-08-18 DIAGNOSIS — I10 Essential (primary) hypertension: Secondary | ICD-10-CM

## 2023-08-18 DIAGNOSIS — Z7985 Long-term (current) use of injectable non-insulin antidiabetic drugs: Secondary | ICD-10-CM | POA: Diagnosis not present

## 2023-08-18 DIAGNOSIS — Z7984 Long term (current) use of oral hypoglycemic drugs: Secondary | ICD-10-CM

## 2023-08-18 DIAGNOSIS — Z8673 Personal history of transient ischemic attack (TIA), and cerebral infarction without residual deficits: Secondary | ICD-10-CM

## 2023-08-18 LAB — POCT GLYCOSYLATED HEMOGLOBIN (HGB A1C): Hemoglobin A1C: 5.6 % (ref 4.0–5.6)

## 2023-08-18 NOTE — Patient Instructions (Signed)
Nice to see you today I will be in touch with the labs once I have reviewed them Follow up with me in 6 months Lab visit in 3 months for a A1C recheck  Consider doing the low dose CT scan for lung cancer screening

## 2023-08-18 NOTE — Assessment & Plan Note (Signed)
Patient lost 25 pounds since last office visit.  On rosuvastatin 20 mg pending lipid panel

## 2023-08-18 NOTE — Assessment & Plan Note (Signed)
Discussed age-appropriate immunizations and screening exams.  Did review patient's personal, surgical, social, family histories.  Patient is up-to-date on all age-appropriate vaccinations he would like.  He refused tetanus, shingles, pneumonia vaccine.  Patient refused CRC screening patient refused lung cancer screening.  PSA up-to-date for prostate cancer screening.  Patient was given information at discharge about preventative healthcare maintenance with anticipatory guidance.

## 2023-08-18 NOTE — Assessment & Plan Note (Signed)
Patient is currently on Protonix continue

## 2023-08-18 NOTE — Progress Notes (Signed)
Established Patient Office Visit  Subjective   Patient ID: Marvin Tapia, male    DOB: December 16, 1971  Age: 52 y.o. MRN: [id number]  Chief Complaint  Patient presents with   Annual Exam    Declines screenings and vaccines.     HPI  for complete physical and follow up of chronic conditions.  HTN: Patient currently maintained on lisinopril 20 mg daily.  DM2: Patient currently maintained on Mounjaro 7.5 mg along with metformin 1000 mg 1 tablet daily, and Farxiga 10 mg. States he has not checked sugars at home does have the q  HLD: Patient currently on rosuvastatin 20 mg daily.  Has been followed by cardiology in setting of recent CVA high intensity statin required  CVA: No deficits  Immunizations: -Tetanus: Completed in unsure -Influenza: refused -Shingles:refused -Pneumonia: refused -COVID: Original 2  Diet: Fair diet. 2 meals a day and some healthy snack. States that he is doing coffee in the am and sugar free powerrade in the day  Exercise: No regular exercise. Employment   Eye exam: Completes annually. No corrective lense Dental exam: dentures , sees prn  Colonoscopy: Refused.  Did discuss colonoscopy, Cologuard, iFOB Lung Cancer Screening: Patient qualifies but would like to think about it  PSA: Drawn in February 2024, within normal limits  Sleep: after after dark and up 330-6. Feels rested. States that he does snore     Review of Systems  Constitutional:  Negative for chills and fever.  Respiratory:  Negative for shortness of breath.   Cardiovascular:  Negative for chest pain and leg swelling.  Gastrointestinal:  Negative for abdominal pain, blood in stool, constipation, diarrhea, nausea and vomiting.       BM daily   Genitourinary:  Negative for dysuria and hematuria.  Neurological:  Negative for tingling and headaches.  Psychiatric/Behavioral:  Negative for hallucinations and suicidal ideas.       Objective:     BP 110/72   Pulse 83   Temp 97.8 F  (36.6 C) (Temporal)   Ht 5\' 10"  (1.778 m)   Wt 181 lb 12.8 oz (82.5 kg)   SpO2 96%   BMI 26.09 kg/m  BP Readings from Last 3 Encounters:  08/18/23 110/72  06/22/23 110/68  04/04/23 124/80   Wt Readings from Last 3 Encounters:  08/18/23 181 lb 12.8 oz (82.5 kg)  06/22/23 190 lb (86.2 kg)  04/04/23 205 lb 9.6 oz (93.3 kg)      Physical Exam Vitals and nursing note reviewed.  Constitutional:      Appearance: Normal appearance.  HENT:     Right Ear: Tympanic membrane, ear canal and external ear normal.     Left Ear: Tympanic membrane, ear canal and external ear normal.     Mouth/Throat:     Mouth: Mucous membranes are moist.     Pharynx: Oropharynx is clear.  Eyes:     Extraocular Movements: Extraocular movements intact.     Pupils: Pupils are equal, round, and reactive to light.  Cardiovascular:     Rate and Rhythm: Normal rate and regular rhythm.     Pulses: Normal pulses.     Heart sounds: Normal heart sounds.  Pulmonary:     Effort: Pulmonary effort is normal.     Breath sounds: Normal breath sounds.  Abdominal:     General: Bowel sounds are normal. There is no distension.     Palpations: There is no mass.     Tenderness: There is no abdominal tenderness.  Hernia: No hernia is present.  Musculoskeletal:     Right lower leg: No edema.     Left lower leg: No edema.  Lymphadenopathy:     Cervical: No cervical adenopathy.  Skin:    General: Skin is warm.  Neurological:     General: No focal deficit present.     Mental Status: He is alert.     Deep Tendon Reflexes:     Reflex Scores:      Bicep reflexes are 2+ on the right side and 2+ on the left side.      Patellar reflexes are 2+ on the right side and 2+ on the left side.    Comments: Bilateral upper and lower extremity strength 5/5  Psychiatric:        Mood and Affect: Mood normal.        Behavior: Behavior normal.        Thought Content: Thought content normal.        Judgment: Judgment normal.       No results found for any visits on 08/18/23.    The ASCVD Risk score (Arnett DK, et al., 2019) failed to calculate for the following reasons:   The patient has a prior MI or stroke diagnosis    Assessment & Plan:   Problem List Items Addressed This Visit       Cardiovascular and Mediastinum   Essential hypertension    Patient, maintained on lisinopril 20 mg daily.  Tolerating it well.  Denies lightheadedness or dizziness in the setting of weight loss.  Continue lisinopril for now      Relevant Orders   CBC   Comprehensive metabolic panel   TSH     Digestive   GERD (gastroesophageal reflux disease)    Patient is currently on Protonix continue        Endocrine   Type 2 diabetes mellitus without complication, without long-term current use of insulin [facility])    Patient on Farxiga, Mounjaro and metformin.  A1c of 5.6 we will discontinue metformin altogether continue [place].  77-month lab follow-up for A1c 20-month in office visit with me      Relevant Orders   CBC   Comprehensive metabolic panel   POCT glycosylated hemoglobin (Hb A1C)   Microalbumin / creatinine urine ratio   Lipid panel     Other   Pure hypercholesterolemia    Patient lost 25 pounds since last office visit.  On rosuvastatin 20 mg pending lipid panel      Relevant Orders   Lipid panel   Colonoscopy refused    Did discuss colonoscopy, Cologuard, iFOB patient declined all 3      History of stroke    No deficits currently on high intensity statin.      Relevant Orders   Lipid panel   Preventative health care - Primary    Discussed age-appropriate immunizations and screening exams.  Did review patient's personal, surgical, social, family histories.  Patient is up-to-date on all age-appropriate vaccinations he would like.  He refused tetanus, shingles, pneumonia vaccine.  Patient refused CRC screening patient refused lung cancer screening.  PSA up-to-date for prostate cancer  screening.  Patient was given information at discharge about preventative healthcare maintenance with anticipatory guidance.      Relevant Orders   CBC   Comprehensive metabolic panel   TSH   Other Visit Diagnoses     Former smoker       Relevant Orders   Urine  Microscopic       Return in about 6 months (around 02/18/2024) for DM recheck.    [name], NP

## 2023-08-18 NOTE — Assessment & Plan Note (Signed)
Did discuss colonoscopy, Cologuard, iFOB patient declined all 3

## 2023-08-18 NOTE — Assessment & Plan Note (Signed)
Patient, maintained on lisinopril 20 mg daily.  Tolerating it well.  Denies lightheadedness or dizziness in the setting of weight loss.  Continue lisinopril for now

## 2023-08-18 NOTE — Assessment & Plan Note (Signed)
Patient on Marvin Tapia and metformin.  A1c of 5.6 we will discontinue metformin altogether continue [place].  55-month lab follow-up for A1c 29-month in office visit with me

## 2023-08-18 NOTE — Assessment & Plan Note (Signed)
No deficits currently on high intensity statin.

## 2023-08-19 LAB — LIPID PANEL
Cholesterol: 121 mg/dL (ref 0–200)
HDL: 36.9 mg/dL — ABNORMAL LOW (ref >39.00)
LDL Cholesterol: 49 mg/dL (ref 0–99)
NonHDL: 84.22
Total CHOL/HDL Ratio: 3
Triglycerides: 175 mg/dL — ABNORMAL HIGH (ref 0.0–149.0)
VLDL: 35 mg/dL (ref 0.0–40.0)

## 2023-08-19 LAB — COMPREHENSIVE METABOLIC PANEL
ALT: 13 U/L (ref 0–53)
AST: 18 U/L (ref 0–37)
Albumin: 4.1 g/dL (ref 3.5–5.2)
Alkaline Phosphatase: 82 U/L (ref 39–117)
BUN: 14 mg/dL (ref 6–23)
CO2: 30 meq/L (ref 19–32)
Calcium: 9.4 mg/dL (ref 8.4–10.5)
Chloride: 101 meq/L (ref 96–112)
Creatinine, Ser: 0.88 mg/dL (ref 0.40–1.50)
GFR: 97.64 mL/min (ref >60.00)
Glucose, Bld: 107 mg/dL — ABNORMAL HIGH (ref 70–99)
Potassium: 4.1 meq/L (ref 3.5–5.1)
Sodium: 138 meq/L (ref 135–145)
Total Bilirubin: 0.7 mg/dL (ref 0.2–1.2)
Total Protein: 6.6 g/dL (ref 6.0–8.3)

## 2023-08-19 LAB — CBC
HCT: 41.6 % (ref 39.0–52.0)
Hemoglobin: 14.2 g/dL (ref 13.0–17.0)
MCHC: 34.1 g/dL (ref 30.0–36.0)
MCV: 88 fl (ref 78.0–100.0)
Platelets: 228 10*3/uL (ref 150.0–400.0)
RBC: 4.73 Mil/uL (ref 4.22–5.81)
RDW: 13.5 % (ref 11.5–15.5)
WBC: 10.4 10*3/uL (ref 4.0–10.5)

## 2023-08-19 LAB — URINALYSIS, MICROSCOPIC ONLY

## 2023-08-19 LAB — MICROALBUMIN / CREATININE URINE RATIO
Creatinine,U: 63.1 mg/dL
Microalb Creat Ratio: 1.1 mg/g (ref 0.0–30.0)
Microalb, Ur: <0.7 mg/dL (ref 0.0–1.9)

## 2023-08-19 LAB — TSH: TSH: 1.15 u[IU]/mL (ref 0.35–5.50)

## 2023-09-16 ENCOUNTER — Encounter: Payer: Self-pay | Admitting: Nurse Practitioner

## 2023-09-20 ENCOUNTER — Other Ambulatory Visit: Payer: Self-pay | Admitting: Nurse Practitioner

## 2023-09-20 DIAGNOSIS — E119 Type 2 diabetes mellitus without complications: Secondary | ICD-10-CM

## 2023-09-21 ENCOUNTER — Other Ambulatory Visit (HOSPITAL_COMMUNITY): Payer: Self-pay

## 2023-09-21 ENCOUNTER — Other Ambulatory Visit: Payer: Self-pay

## 2023-09-21 MED ORDER — MOUNJARO 7.5 MG/0.5ML ~~LOC~~ SOAJ
7.5000 mg | SUBCUTANEOUS | 1 refills | Status: DC
  Filled 2023-09-21: qty 6, 84d supply, fill #0
  Filled 2024-01-03: qty 6, 84d supply, fill #1

## 2023-09-22 ENCOUNTER — Other Ambulatory Visit (HOSPITAL_COMMUNITY): Payer: Self-pay

## 2023-09-22 ENCOUNTER — Other Ambulatory Visit: Payer: Self-pay

## 2023-09-23 ENCOUNTER — Other Ambulatory Visit: Payer: Self-pay

## 2023-10-04 NOTE — Progress Notes (Deleted)
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 Hypertension Hyperlipidemia Type 2 diabetes mellitus   Due to higher suspicion of possible cardiac source, will send to cardiology for consideration of implantable loop recorder Secondary stroke prevention as managed by PCP: Aspirin to 81mg  daily Rosuvastatin.  LDL goal less than 70 Normotensive blood pressure Hgb A1c goal less than 7 Lifestyle modification: Mediterranean diet Cardiovascular exercise Follow up with me as needed.       Subjective:  Marvin Tapia is a 52 year old right-handed male with DM II, HTN, and HLD and former cigarette smoker who follows up for stroke.  UPDATE: Current medications:  ASA 81mg , Farxiga, tirzepatide, metformin, lisinopril, rosuvastatin  4 week cardiac event monitor from 3/18-4/16/2024 showed NSR without episodes of atrial fibrillation or prolonged pauses.  Labs from 08/18/2023 revealed TG 175, LDL 49, and Hgb A1c 5.6   HISTORY: On 10/15/2022, he was at work when he developed sudden onset of right arm numbness and decreased grip strength and hand locked up, slight right leg weakness as well as slight slurred speech.  Symptoms lasted several hours.  He went to [facility] ED.  CT head showed no acute abnormality.  Due to prolonged wait time in the CT, he left.  MRI of head on 10/19/2022 demonstrated small early subacute infarcts in the left frontal lobe involving the pre and postcentral gyri and middle frontal gyrus.  Started ASA.  Lovastatin was changed to rosuvastatin  Carotid ultrasound on 10/20/2022 revealed 1-39% ICA stenosis bilaterally and antegrade flow of vertebral arteries bilaterally.  2D echocardiogram on 10/29/2022 showed LVEF 60-65% without valvulopathy or atrial level shunt.  CTA Head on 11/10/2022 personally reviewed revealed no LVO or hemodynamically significant stenosis.   Referred to Dr. Tresa Endo of cardiology.  With TTE negative for shunt, TEE was not felt to be indicated.  Coronary calcium score was 0.     Since the stroke he continues to have right sided numbness.  He also feels lightheaded at times.    Previous labs revealed Hgb A1c 7.7 on 08/09/2022 and LDL 111 with TG 174 on 04/23/2022.  PAST MEDICAL HISTORY: Past Medical History:  Diagnosis Date   Diabetes mellitus without complication (HCC)    GERD (gastroesophageal reflux disease)    Hyperlipidemia    Hypertension    Stroke [facility])     MEDICATIONS: Current Outpatient Medications on File Prior to Visit  Medication Sig Dispense Refill   ASPIRIN 81 PO Take by mouth. 1 tablet once daily     dapagliflozin propanediol (FARXIGA) 10 MG TABS tablet Take 1 tablet (10 mg total) by mouth daily before breakfast. 90 tablet 1   glucose blood (FREESTYLE LITE) test strip Use as directed in the morning and at bedtime. 200 each 12   lisinopril (ZESTRIL) 20 MG tablet Take 1 tablet by mouth daily. 90 tablet 1   pantoprazole (PROTONIX) 40 MG tablet Take 1 tablet (40 mg total) by mouth daily. 90 tablet 0   rosuvastatin (CRESTOR) 20 MG tablet Take 1 tablet (20 mg total) by mouth daily. 90 tablet 2   tirzepatide (MOUNJARO) 7.5 MG/0.5ML Pen Inject 7.5 mg into the skin once a week. 6 mL 1   No current facility-administered medications on file prior to visit.    ALLERGIES: Allergies  Allergen Reactions   Sulfa Antibiotics     Unknown    FAMILY HISTORY: Family  History  Problem Relation Age of Onset   Hyperlipidemia Father    Heart disease Father    Diabetes Brother    Hypertension Brother       Objective:  *** General: No acute distress.  Patient appears well-groomed.   Head:  Normocephalic/atraumatic Eyes:  Fundi examined but not visualized Neck: supple, no paraspinal tenderness, full range of motion Heart:  Regular rate and rhythm Lungs:  Clear to auscultation bilaterally Back: No paraspinal  tenderness Neurological Exam: alert and oriented.  Speech fluent and not dysarthric, language intact.  CN II-XII intact. Bulk and tone normal, muscle strength 5/5 throughout.  Sensation to pinprick and vibration intact.  Deep tendon reflexes 2+ throughout, toes downgoing.  Finger to nose testing intact.  Gait normal, Romberg negative.   Shon Millet, DO  CC: Mordecai Maes, NP

## 2023-10-05 ENCOUNTER — Other Ambulatory Visit (HOSPITAL_COMMUNITY): Payer: Self-pay

## 2023-10-05 ENCOUNTER — Other Ambulatory Visit: Payer: Self-pay | Admitting: Nurse Practitioner

## 2023-10-05 ENCOUNTER — Other Ambulatory Visit: Payer: Self-pay | Admitting: Cardiovascular Disease

## 2023-10-05 ENCOUNTER — Other Ambulatory Visit: Payer: Self-pay

## 2023-10-05 ENCOUNTER — Ambulatory Visit: Payer: 59 | Admitting: Neurology

## 2023-10-05 MED ORDER — PANTOPRAZOLE SODIUM 40 MG PO TBEC
40.0000 mg | DELAYED_RELEASE_TABLET | Freq: Every day | ORAL | 0 refills | Status: DC
  Filled 2023-10-05: qty 90, 90d supply, fill #0

## 2023-10-06 ENCOUNTER — Other Ambulatory Visit: Payer: Self-pay

## 2023-10-06 ENCOUNTER — Other Ambulatory Visit (HOSPITAL_COMMUNITY): Payer: Self-pay

## 2023-10-06 MED ORDER — ROSUVASTATIN CALCIUM 20 MG PO TABS
20.0000 mg | ORAL_TABLET | Freq: Every day | ORAL | 2 refills | Status: DC
  Filled 2023-10-06: qty 90, 90d supply, fill #0
  Filled 2024-02-12: qty 90, 90d supply, fill #1
  Filled 2024-04-19 – 2024-04-25 (×2): qty 90, 90d supply, fill #2

## 2023-10-24 ENCOUNTER — Ambulatory Visit (INDEPENDENT_AMBULATORY_CARE_PROVIDER_SITE_OTHER): Payer: No Typology Code available for payment source | Admitting: Nurse Practitioner

## 2023-10-24 VITALS — BP 118/88 | HR 86 | Temp 97.7°F | Ht 70.0 in | Wt 187.4 lb

## 2023-10-24 DIAGNOSIS — Z7984 Long term (current) use of oral hypoglycemic drugs: Secondary | ICD-10-CM

## 2023-10-24 DIAGNOSIS — E119 Type 2 diabetes mellitus without complications: Secondary | ICD-10-CM | POA: Diagnosis not present

## 2023-10-24 DIAGNOSIS — Z7985 Long-term (current) use of injectable non-insulin antidiabetic drugs: Secondary | ICD-10-CM

## 2023-10-24 LAB — POCT GLYCOSYLATED HEMOGLOBIN (HGB A1C): Hemoglobin A1C: 5.7 % — AB (ref 4.0–5.6)

## 2023-10-24 NOTE — Progress Notes (Signed)
Established Patient Office Visit  Subjective   Patient ID: Marvin Tapia, male    DOB: 01/07/1971  Age: 52 y.o. MRN: [id number]  Chief Complaint  Patient presents with   Diabetes    Pt states he's been doing good. 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      DM2: Patient currently maintained on tirzepatide 7.5 mg daily and Farxiga 10 mg daily. States that he is having more soda while on the road States that he has not felt any low low glucose State that he did go a period of approx 2 weeks without the tirzepatide. He has started back and has tolerated it well.  Last office visit he was taken off the metformin   HTN: Patient currently maintained on lisinopril 20 mg daily. Tolerating the medication well. He denies any lightheadedness or dizziness       Review of Systems  Constitutional:  Negative for chills and fever.  Respiratory:  Negative for shortness of breath.   Cardiovascular:  Negative for chest pain.  Gastrointestinal:  Negative for abdominal pain, constipation, diarrhea, nausea and vomiting.  Neurological:  Negative for dizziness, tingling and headaches.      Objective:     BP 118/88   Pulse 86   Temp 97.7 F (36.5 C) (Oral)   Ht 5\' 10"  (1.778 m)   Wt 187 lb 6.4 oz (85 kg)   SpO2 98%   BMI 26.89 kg/m  BP Readings from Last 3 Encounters:  10/24/23 118/88  08/18/23 110/72  06/22/23 110/68   Wt Readings from Last 3 Encounters:  10/24/23 187 lb 6.4 oz (85 kg)  08/18/23 181 lb 12.8 oz (82.5 kg)  06/22/23 190 lb (86.2 kg)   SpO2 Readings from Last 3 Encounters:  10/24/23 98%  08/18/23 96%  04/04/23 99%      Physical Exam Vitals and nursing note reviewed.  Constitutional:      Appearance: Normal appearance.  Cardiovascular:     Rate and Rhythm: Normal rate and regular rhythm.     Heart sounds: Normal heart sounds.  Pulmonary:     Effort: Pulmonary effort is normal.     Breath sounds: Normal breath  sounds.  Abdominal:     General: Bowel sounds are normal.  Neurological:     Mental Status: He is alert.      Results for orders placed or performed in visit on 10/24/23  POCT glycosylated hemoglobin (Hb A1C)  Result Value Ref Range   Hemoglobin A1C 5.7 (A) 4.0 - 5.6 %   HbA1c POC (<> result, manual entry)     HbA1c, POC (prediabetic range)     HbA1c, POC (controlled diabetic range)        The ASCVD Risk score (Arnett DK, et al., 2019) failed to calculate for the following reasons:   The patient has a prior MI or stroke diagnosis    Assessment & Plan:   Problem List Items Addressed This Visit       Endocrine   Type 2 diabetes mellitus without complication, without long-term current use of insulin (HCC) - Primary    Patient currently maintained on Farxiga 10 mg and tirzepatide 7.5 mg once weekly.  Patient tolerating medications well.  Weight has stabilized with a few pound increase.  No low glucoses per patient report continue medication as prescribed follow-up 6 months      Relevant Orders   POCT glycosylated hemoglobin (Hb A1C) (  Completed)    Return in about 6 months (around 04/23/2024) for DM recheck.    [name], NP

## 2023-10-24 NOTE — Assessment & Plan Note (Signed)
Patient currently maintained on Farxiga 10 mg and tirzepatide 7.5 mg once weekly.  Patient tolerating medications well.  Weight has stabilized with a few pound increase.  No low glucoses per patient report continue medication as prescribed follow-up 6 months

## 2023-10-24 NOTE — Patient Instructions (Addendum)
Nice to see you today Your A1C was 5.7  We will continue the medications as is Follow up with me in 6 months, sooner if you need me

## 2023-11-18 ENCOUNTER — Other Ambulatory Visit: Payer: 59

## 2023-11-23 ENCOUNTER — Other Ambulatory Visit: Payer: Self-pay

## 2023-11-23 ENCOUNTER — Other Ambulatory Visit: Payer: Self-pay | Admitting: Nurse Practitioner

## 2023-11-23 ENCOUNTER — Other Ambulatory Visit (HOSPITAL_COMMUNITY): Payer: Self-pay

## 2023-11-23 MED ORDER — LISINOPRIL 20 MG PO TABS
20.0000 mg | ORAL_TABLET | Freq: Every day | ORAL | 1 refills | Status: DC
  Filled 2023-11-23: qty 90, 90d supply, fill #0
  Filled 2024-03-16: qty 90, 90d supply, fill #1

## 2024-01-04 ENCOUNTER — Other Ambulatory Visit: Payer: Self-pay

## 2024-02-07 ENCOUNTER — Other Ambulatory Visit: Payer: Self-pay

## 2024-02-07 ENCOUNTER — Other Ambulatory Visit: Payer: Self-pay | Admitting: Nurse Practitioner

## 2024-02-07 DIAGNOSIS — E119 Type 2 diabetes mellitus without complications: Secondary | ICD-10-CM

## 2024-02-07 MED ORDER — DAPAGLIFLOZIN PROPANEDIOL 10 MG PO TABS
10.0000 mg | ORAL_TABLET | Freq: Every day | ORAL | 1 refills | Status: DC
  Filled 2024-02-07: qty 90, 90d supply, fill #0
  Filled 2024-03-16 – 2024-04-19 (×2): qty 90, 90d supply, fill #1

## 2024-02-12 ENCOUNTER — Other Ambulatory Visit: Payer: Self-pay | Admitting: Nurse Practitioner

## 2024-02-13 ENCOUNTER — Other Ambulatory Visit: Payer: Self-pay

## 2024-02-13 ENCOUNTER — Other Ambulatory Visit (HOSPITAL_COMMUNITY): Payer: Self-pay

## 2024-02-13 MED ORDER — PANTOPRAZOLE SODIUM 40 MG PO TBEC
40.0000 mg | DELAYED_RELEASE_TABLET | Freq: Every day | ORAL | 0 refills | Status: DC
  Filled 2024-02-13: qty 90, 90d supply, fill #0

## 2024-02-14 ENCOUNTER — Other Ambulatory Visit (HOSPITAL_COMMUNITY): Payer: Self-pay

## 2024-02-20 ENCOUNTER — Ambulatory Visit: Payer: 59 | Admitting: Nurse Practitioner

## 2024-02-23 ENCOUNTER — Ambulatory Visit: Payer: 59 | Admitting: Nurse Practitioner

## 2024-02-24 ENCOUNTER — Encounter: Payer: Self-pay | Admitting: Nurse Practitioner

## 2024-03-16 ENCOUNTER — Other Ambulatory Visit (HOSPITAL_COMMUNITY): Payer: Self-pay

## 2024-03-16 ENCOUNTER — Other Ambulatory Visit: Payer: Self-pay

## 2024-03-16 ENCOUNTER — Other Ambulatory Visit: Payer: Self-pay | Admitting: Nurse Practitioner

## 2024-03-16 DIAGNOSIS — E119 Type 2 diabetes mellitus without complications: Secondary | ICD-10-CM

## 2024-03-19 MED ORDER — MOUNJARO 7.5 MG/0.5ML ~~LOC~~ SOAJ
7.5000 mg | SUBCUTANEOUS | 1 refills | Status: DC
  Filled 2024-03-19 – 2024-03-24 (×2): qty 6, 84d supply, fill #0
  Filled 2024-07-21: qty 2, 28d supply, fill #1
  Filled 2024-10-02: qty 2, 28d supply, fill #2
  Filled 2024-12-05: qty 2, 28d supply, fill #3

## 2024-03-20 ENCOUNTER — Other Ambulatory Visit (HOSPITAL_COMMUNITY): Payer: Self-pay

## 2024-03-24 ENCOUNTER — Other Ambulatory Visit (HOSPITAL_BASED_OUTPATIENT_CLINIC_OR_DEPARTMENT_OTHER): Payer: Self-pay

## 2024-03-24 ENCOUNTER — Other Ambulatory Visit (HOSPITAL_COMMUNITY): Payer: Self-pay

## 2024-04-19 ENCOUNTER — Other Ambulatory Visit (HOSPITAL_COMMUNITY): Payer: Self-pay

## 2024-04-19 ENCOUNTER — Other Ambulatory Visit: Payer: Self-pay

## 2024-04-19 ENCOUNTER — Other Ambulatory Visit: Payer: Self-pay | Admitting: Nurse Practitioner

## 2024-04-19 MED ORDER — LISINOPRIL 20 MG PO TABS
20.0000 mg | ORAL_TABLET | Freq: Every day | ORAL | 1 refills | Status: AC
  Filled 2024-04-19 – 2024-06-10 (×2): qty 90, 90d supply, fill #0
  Filled 2024-10-02: qty 90, 90d supply, fill #1

## 2024-05-16 ENCOUNTER — Other Ambulatory Visit (HOSPITAL_COMMUNITY): Payer: Self-pay

## 2024-06-10 ENCOUNTER — Other Ambulatory Visit: Payer: Self-pay | Admitting: Nurse Practitioner

## 2024-06-11 ENCOUNTER — Other Ambulatory Visit (HOSPITAL_COMMUNITY): Payer: Self-pay

## 2024-06-11 ENCOUNTER — Other Ambulatory Visit: Payer: Self-pay

## 2024-06-11 MED ORDER — PANTOPRAZOLE SODIUM 40 MG PO TBEC
40.0000 mg | DELAYED_RELEASE_TABLET | Freq: Every day | ORAL | 0 refills | Status: DC
  Filled 2024-06-11: qty 90, 90d supply, fill #0

## 2024-06-12 ENCOUNTER — Other Ambulatory Visit: Payer: Self-pay

## 2024-07-23 ENCOUNTER — Other Ambulatory Visit (HOSPITAL_COMMUNITY): Payer: Self-pay

## 2024-08-19 ENCOUNTER — Other Ambulatory Visit: Payer: Self-pay | Admitting: Nurse Practitioner

## 2024-08-19 DIAGNOSIS — E119 Type 2 diabetes mellitus without complications: Secondary | ICD-10-CM

## 2024-08-20 ENCOUNTER — Other Ambulatory Visit: Payer: Self-pay

## 2024-08-20 ENCOUNTER — Other Ambulatory Visit (HOSPITAL_COMMUNITY): Payer: Self-pay

## 2024-08-20 MED ORDER — DAPAGLIFLOZIN PROPANEDIOL 10 MG PO TABS
10.0000 mg | ORAL_TABLET | Freq: Every day | ORAL | 1 refills | Status: AC
  Filled 2024-08-20: qty 90, 90d supply, fill #0
  Filled 2024-12-05: qty 90, 90d supply, fill #1
  Filled 2024-12-08: qty 90, 90d supply, fill #0

## 2024-10-02 ENCOUNTER — Other Ambulatory Visit: Payer: Self-pay

## 2024-10-02 ENCOUNTER — Other Ambulatory Visit (HOSPITAL_COMMUNITY): Payer: Self-pay

## 2024-10-02 ENCOUNTER — Other Ambulatory Visit: Payer: Self-pay | Admitting: Nurse Practitioner

## 2024-10-02 MED ORDER — PANTOPRAZOLE SODIUM 40 MG PO TBEC
40.0000 mg | DELAYED_RELEASE_TABLET | Freq: Every day | ORAL | 0 refills | Status: DC
  Filled 2024-10-02: qty 90, 90d supply, fill #0

## 2024-10-03 ENCOUNTER — Other Ambulatory Visit: Payer: Self-pay

## 2024-10-21 ENCOUNTER — Encounter: Payer: Self-pay | Admitting: Nurse Practitioner

## 2024-10-21 DIAGNOSIS — E7800 Pure hypercholesterolemia, unspecified: Secondary | ICD-10-CM

## 2024-10-23 ENCOUNTER — Other Ambulatory Visit: Payer: Self-pay

## 2024-10-23 MED ORDER — ROSUVASTATIN CALCIUM 20 MG PO TABS
20.0000 mg | ORAL_TABLET | Freq: Every day | ORAL | 0 refills | Status: DC
  Filled 2024-10-23: qty 30, 30d supply, fill #0

## 2024-10-23 NOTE — Telephone Encounter (Signed)
 Needs office visit with me in the next 30 days for DM2

## 2024-11-02 ENCOUNTER — Other Ambulatory Visit (HOSPITAL_COMMUNITY): Payer: Self-pay

## 2024-11-14 ENCOUNTER — Encounter: Payer: Self-pay | Admitting: Nurse Practitioner

## 2024-11-14 ENCOUNTER — Ambulatory Visit: Admitting: Nurse Practitioner

## 2024-11-14 VITALS — BP 122/82 | HR 65 | Temp 97.9°F | Ht 70.0 in | Wt 185.2 lb

## 2024-11-14 DIAGNOSIS — Z7985 Long-term (current) use of injectable non-insulin antidiabetic drugs: Secondary | ICD-10-CM

## 2024-11-14 DIAGNOSIS — Z8673 Personal history of transient ischemic attack (TIA), and cerebral infarction without residual deficits: Secondary | ICD-10-CM

## 2024-11-14 DIAGNOSIS — Z126 Encounter for screening for malignant neoplasm of bladder: Secondary | ICD-10-CM | POA: Diagnosis not present

## 2024-11-14 DIAGNOSIS — E7800 Pure hypercholesterolemia, unspecified: Secondary | ICD-10-CM | POA: Diagnosis not present

## 2024-11-14 DIAGNOSIS — Z0000 Encounter for general adult medical examination without abnormal findings: Secondary | ICD-10-CM | POA: Diagnosis not present

## 2024-11-14 DIAGNOSIS — E119 Type 2 diabetes mellitus without complications: Secondary | ICD-10-CM

## 2024-11-14 DIAGNOSIS — Z5320 Procedure and treatment not carried out because of patient's decision for unspecified reasons: Secondary | ICD-10-CM

## 2024-11-14 DIAGNOSIS — Z125 Encounter for screening for malignant neoplasm of prostate: Secondary | ICD-10-CM

## 2024-11-14 DIAGNOSIS — K219 Gastro-esophageal reflux disease without esophagitis: Secondary | ICD-10-CM

## 2024-11-14 DIAGNOSIS — I10 Essential (primary) hypertension: Secondary | ICD-10-CM | POA: Diagnosis not present

## 2024-11-14 LAB — CBC WITH DIFFERENTIAL/PLATELET
Basophils Absolute: 0 K/uL (ref 0.0–0.1)
Basophils Relative: 0.4 % (ref 0.0–3.0)
Eosinophils Absolute: 0.1 K/uL (ref 0.0–0.7)
Eosinophils Relative: 1.2 % (ref 0.0–5.0)
HCT: 43 % (ref 39.0–52.0)
Hemoglobin: 15.1 g/dL (ref 13.0–17.0)
Lymphocytes Relative: 33.9 % (ref 12.0–46.0)
Lymphs Abs: 2.6 K/uL (ref 0.7–4.0)
MCHC: 35 g/dL (ref 30.0–36.0)
MCV: 87.5 fl (ref 78.0–100.0)
Monocytes Absolute: 0.6 K/uL (ref 0.1–1.0)
Monocytes Relative: 8.4 % (ref 3.0–12.0)
Neutro Abs: 4.2 K/uL (ref 1.4–7.7)
Neutrophils Relative %: 56.1 % (ref 43.0–77.0)
Platelets: 187 K/uL (ref 150.0–400.0)
RBC: 4.92 Mil/uL (ref 4.22–5.81)
RDW: 13.7 % (ref 11.5–15.5)
WBC: 7.5 K/uL (ref 4.0–10.5)

## 2024-11-14 LAB — TSH: TSH: 0.89 u[IU]/mL (ref 0.35–5.50)

## 2024-11-14 LAB — MICROALBUMIN / CREATININE URINE RATIO
Creatinine,U: 96.2 mg/dL
Microalb Creat Ratio: 14.2 mg/g (ref 0.0–30.0)
Microalb, Ur: 1.4 mg/dL (ref 0.0–1.9)

## 2024-11-14 LAB — POCT GLYCOSYLATED HEMOGLOBIN (HGB A1C): Hemoglobin A1C: 5.6 % (ref 4.0–5.6)

## 2024-11-14 LAB — LIPID PANEL
Cholesterol: 117 mg/dL (ref 0–200)
HDL: 38.5 mg/dL — ABNORMAL LOW (ref >39.00)
LDL Cholesterol: 60 mg/dL (ref 0–99)
NonHDL: 78.91
Total CHOL/HDL Ratio: 3
Triglycerides: 95 mg/dL (ref 0.0–149.0)
VLDL: 19 mg/dL (ref 0.0–40.0)

## 2024-11-14 LAB — COMPREHENSIVE METABOLIC PANEL WITH GFR
ALT: 15 U/L (ref 0–53)
AST: 19 U/L (ref 0–37)
Albumin: 4.2 g/dL (ref 3.5–5.2)
Alkaline Phosphatase: 63 U/L (ref 39–117)
BUN: 12 mg/dL (ref 6–23)
CO2: 27 meq/L (ref 19–32)
Calcium: 8.8 mg/dL (ref 8.4–10.5)
Chloride: 105 meq/L (ref 96–112)
Creatinine, Ser: 0.81 mg/dL (ref 0.40–1.50)
GFR: 99.24 mL/min (ref >60.00)
Glucose, Bld: 122 mg/dL — ABNORMAL HIGH (ref 70–99)
Potassium: 4 meq/L (ref 3.5–5.1)
Sodium: 139 meq/L (ref 135–145)
Total Bilirubin: 0.7 mg/dL (ref 0.2–1.2)
Total Protein: 6.5 g/dL (ref 6.0–8.3)

## 2024-11-14 LAB — URINALYSIS, MICROSCOPIC ONLY: RBC / HPF: NONE SEEN (ref 0–?)

## 2024-11-14 LAB — PSA: PSA: 0.59 ng/mL (ref 0.10–4.00)

## 2024-11-14 NOTE — Patient Instructions (Signed)
 Nice to see you today I will be in touch with the labs once I have them Follow up with me in 6 months, sooner If you need me

## 2024-11-14 NOTE — Assessment & Plan Note (Signed)
 Discussed age-appropriate immunizations and screening exams.  Did review patient's personal, surgical, social, family histories.  Patient refused all age-appropriate vaccinations.  Patient refused CRC screening.  PSA today for prostate cancer screening.  Patient refused LDCT for lung cancer screening.  Patient was given information for [facility] guidance.

## 2024-11-14 NOTE — Assessment & Plan Note (Addendum)
 Diabetes well-controlled with A1c of 5.6%.  Patient on rosuvastatin  20 mg daily.  Patient's blood pressure controlled.  No focal deficits

## 2024-11-14 NOTE — Progress Notes (Signed)
 Established Patient Office Visit  Subjective   Patient ID: Marvin Tapia, male    DOB: 1971-01-23  Age: 53 y.o. MRN: [id number]  Chief Complaint  Patient presents with   Diabetes      DM2: currently on mounjaro  7.6mg  and farxiga  10mg  dialy. Does not check blood sugar at home. Denies any hypoglycemia   HTN: on lisinopril  20 mg daiy   CVA: hx of the same he is on lisinopril  20 for HTN and Crestor  20mg  daily. DM2 is well controlled   for complete physical and follow up of chronic conditions.  Immunizations: -Tetanus:  refused -Influenza: refused -Shingles: refused -Pneumonia: refused   Diet: Fair diet. He is eating 1 meal and snacks. He has been doing some sodas and coffee. Exercise: No regular exercise. With his employment   Eye exam: Completes annually. Needs updating  Dental exam: plates, PRN    Colonoscopy: refused   Lung Cancer Screening:  refused   PSA: Due      Review of Systems  Constitutional:  Negative for chills and fever.  Respiratory:  Negative for shortness of breath.   Cardiovascular:  Negative for chest pain and leg swelling.  Gastrointestinal:  Negative for abdominal pain, blood in stool, constipation, diarrhea, nausea and vomiting.       BM daily   Genitourinary:  Negative for dysuria and hematuria.  Neurological:  Negative for dizziness, tingling and headaches.  Psychiatric/Behavioral:  Negative for hallucinations and suicidal ideas.       Objective:     BP 122/82   Pulse 65   Temp 97.9 F (36.6 C) (Oral)   Ht 5' 10 (1.778 m)   Wt 185 lb 3.2 oz (84 kg)   SpO2 97%   BMI 26.57 kg/m  BP Readings from Last 3 Encounters:  11/14/24 122/82  10/24/23 118/88  08/18/23 110/72   Wt Readings from Last 3 Encounters:  11/14/24 185 lb 3.2 oz (84 kg)  10/24/23 187 lb 6.4 oz (85 kg)  08/18/23 181 lb 12.8 oz (82.5 kg)   SpO2 Readings from Last 3 Encounters:  11/14/24 97%  10/24/23 98%  08/18/23 96%      Physical Exam Vitals and  nursing note reviewed.  Constitutional:      Appearance: Normal appearance.  HENT:     Right Ear: Tympanic membrane, ear canal and external ear normal.     Left Ear: Tympanic membrane, ear canal and external ear normal.     Mouth/Throat:     Mouth: Mucous membranes are moist.     Pharynx: Oropharynx is clear.  Eyes:     Extraocular Movements: Extraocular movements intact.     Pupils: Pupils are equal, round, and reactive to light.  Cardiovascular:     Rate and Rhythm: Normal rate and regular rhythm.     Pulses: Normal pulses.     Heart sounds: Normal heart sounds.  Pulmonary:     Effort: Pulmonary effort is normal.     Breath sounds: Normal breath sounds.  Abdominal:     General: Bowel sounds are normal. There is no distension.     Palpations: There is no mass.     Tenderness: There is no abdominal tenderness.     Hernia: No hernia is present.  Musculoskeletal:     Right lower leg: No edema.     Left lower leg: No edema.  Lymphadenopathy:     Cervical: No cervical adenopathy.  Skin:    General: Skin is warm.  Neurological:     General: No focal deficit present.     Mental Status: He is alert.     Deep Tendon Reflexes:     Reflex Scores:      Bicep reflexes are 2+ on the right side and 2+ on the left side.      Patellar reflexes are 2+ on the right side and 2+ on the left side.    Comments: Bilateral upper and lower extremity strength 5/5  Psychiatric:        Mood and Affect: Mood normal.        Behavior: Behavior normal.        Thought Content: Thought content normal.        Judgment: Judgment normal.    Title   Diabetic Foot Exam - detailed Is there a history of foot ulcer?: No Is there a foot ulcer now?: No Is there swelling?: No Is there elevated skin temperature?: No Is there abnormal foot shape?: No Is there a claw toe deformity?: No Are the toenails long?: No Are the toenails thick?: Yes Are the toenails ingrown?: No Pulse Foot Exam completed.: Yes    Right Posterior Tibialis: Present Left posterior Tibialis: Present   Right Dorsalis Pedis: Present Left Dorsalis Pedis: Present     Sensory Foot Exam Completed.: Yes Semmes-Weinstein Monofilament Test + means has sensation and - means no sensation   R Site 1-Great Toe: Neg       Image components are not supported.   Image components are not supported. Image components are not supported.  T[place] Comments Sensation present in bilateral feet  Absent on site 1 on the right foot      Results for orders placed or performed in visit on 11/14/24  POCT glycosylated hemoglobin (Hb A1C)  Result Value Ref Range   Hemoglobin A1C 5.6 4.0 - 5.6 %   HbA1c POC (<> result, manual entry)     HbA1c, POC (prediabetic range)     HbA1c, POC (controlled diabetic range)        The ASCVD Risk score (Arnett DK, et al., 2019) failed to calculate for the following reasons:   Risk score cannot be calculated because patient has a medical history suggesting prior/existing ASCVD    Assessment & Plan:   Problem List Items Addressed This Visit       Cardiovascular and Mediastinum   Essential hypertension   Patient currently maintained on lisinopril  20 mg daily.  Blood pressure controlled.  Continue medication as prescribed      Relevant Orders   Comprehensive metabolic panel with GFR   CBC with Differential/Platelet   TSH   Lipid panel     Digestive   GERD (gastroesophageal reflux disease)   Patient currently maintained on Protonix  40 mg daily.  Stable continue        Endocrine   Type 2 diabetes mellitus without complication, without long-term current use of insulin (HCC)   Currently maintained on Mounjaro  7.5 mg weekly and Farxiga  10 mg daily.  Patient does not check blood glucose at home.  A1c well-controlled at 5.6% today.  Continue medication as prescribed patient denies any hypoglycemia      Relevant Orders   POCT glycosylated hemoglobin (Hb A1C) (Completed)    Comprehensive metabolic panel with GFR   CBC with Differential/Platelet   Microalbumin / creatinine urine ratio   Lipid panel     Other   Pure hypercholesterolemia   History of the same in the setting of  CVA patient currently maintained on rosuvastatin  20 mg daily.  Pending lipid panel today      Relevant Orders   Lipid panel   Colonoscopy refused   History of stroke   Diabetes well-controlled with A1c of 5.6%.  Patient on rosuvastatin  20 mg daily.  Patient's blood pressure controlled.  No focal deficits      Preventative health care - Primary   Discussed age-appropriate immunizations and screening exams.  Did review patient's personal, surgical, social, family histories.  Patient refused all age-appropriate vaccinations.  Patient refused CRC screening.  PSA today for prostate cancer screening.  Patient refused LDCT for lung cancer screening.  Patient was given information for [facility] guidance.      Relevant Orders   Comprehensive metabolic panel with GFR   CBC with Differential/Platelet   TSH   Other Visit Diagnoses       Screening for bladder cancer       Relevant Orders   Urine Microscopic     Screening for prostate cancer       Relevant Orders   PSA       Return in about 6 months (around 05/14/2025) for DM recheck.    Adina Crandall, NP

## 2024-11-14 NOTE — Assessment & Plan Note (Signed)
 Patient currently maintained on Protonix  40 mg daily.  Stable continue

## 2024-11-14 NOTE — Assessment & Plan Note (Signed)
 Patient currently maintained on lisinopril  20 mg daily.  Blood pressure controlled.  Continue medication as prescribed

## 2024-11-14 NOTE — Assessment & Plan Note (Signed)
 Currently maintained on Mounjaro  7.5 mg weekly and Farxiga  10 mg daily.  Patient does not check blood glucose at home.  A1c well-controlled at 5.6% today.  Continue medication as prescribed patient denies any hypoglycemia

## 2024-11-14 NOTE — Assessment & Plan Note (Signed)
 History of the same in the setting of CVA patient currently maintained on rosuvastatin  20 mg daily.  Pending lipid panel today

## 2024-11-15 ENCOUNTER — Ambulatory Visit: Payer: Self-pay | Admitting: Nurse Practitioner

## 2024-12-05 ENCOUNTER — Other Ambulatory Visit: Payer: Self-pay | Admitting: Nurse Practitioner

## 2024-12-05 ENCOUNTER — Other Ambulatory Visit (HOSPITAL_COMMUNITY): Payer: Self-pay

## 2024-12-05 DIAGNOSIS — E7800 Pure hypercholesterolemia, unspecified: Secondary | ICD-10-CM

## 2024-12-06 ENCOUNTER — Other Ambulatory Visit: Payer: Self-pay

## 2024-12-06 ENCOUNTER — Other Ambulatory Visit (HOSPITAL_COMMUNITY): Payer: Self-pay

## 2024-12-06 MED ORDER — PANTOPRAZOLE SODIUM 40 MG PO TBEC
40.0000 mg | DELAYED_RELEASE_TABLET | Freq: Every day | ORAL | 1 refills | Status: AC
  Filled 2024-12-06: qty 90, 90d supply, fill #0

## 2024-12-06 MED ORDER — ROSUVASTATIN CALCIUM 20 MG PO TABS
20.0000 mg | ORAL_TABLET | Freq: Every day | ORAL | 1 refills | Status: AC
  Filled 2024-12-06 – 2024-12-10 (×2): qty 90, 90d supply, fill #0

## 2024-12-07 ENCOUNTER — Other Ambulatory Visit (HOSPITAL_COMMUNITY): Payer: Self-pay

## 2024-12-08 ENCOUNTER — Other Ambulatory Visit (HOSPITAL_COMMUNITY): Payer: Self-pay

## 2024-12-08 ENCOUNTER — Other Ambulatory Visit: Payer: Self-pay

## 2024-12-10 ENCOUNTER — Other Ambulatory Visit: Payer: Self-pay

## 2024-12-10 ENCOUNTER — Encounter (HOSPITAL_COMMUNITY): Payer: Self-pay

## 2024-12-10 ENCOUNTER — Other Ambulatory Visit (HOSPITAL_COMMUNITY): Payer: Self-pay

## 2024-12-11 ENCOUNTER — Other Ambulatory Visit: Payer: Self-pay

## 2024-12-27 ENCOUNTER — Other Ambulatory Visit: Payer: Self-pay | Admitting: Nurse Practitioner

## 2024-12-27 DIAGNOSIS — E119 Type 2 diabetes mellitus without complications: Secondary | ICD-10-CM

## 2024-12-28 ENCOUNTER — Other Ambulatory Visit (HOSPITAL_COMMUNITY): Payer: Self-pay

## 2024-12-28 MED ORDER — MOUNJARO 7.5 MG/0.5ML ~~LOC~~ SOAJ
7.5000 mg | SUBCUTANEOUS | 1 refills | Status: AC
  Filled 2024-12-28 – 2024-12-29 (×2): qty 6, 84d supply, fill #0

## 2024-12-29 ENCOUNTER — Other Ambulatory Visit (HOSPITAL_COMMUNITY): Payer: Self-pay

## 2025-05-14 ENCOUNTER — Ambulatory Visit: Admitting: Nurse Practitioner
